# Patient Record
Sex: Male | Born: 1975 | Race: White | Hispanic: No | Marital: Single | Smoking: Current every day smoker
Health system: Southern US, Community
[De-identification: ages and names within clinical notes are randomized; demographics above are authoritative.]

## PROBLEM LIST (undated history)

## (undated) DIAGNOSIS — S069X9A Unspecified intracranial injury with loss of consciousness of unspecified duration, initial encounter: Secondary | ICD-10-CM

## (undated) DIAGNOSIS — S069XAA Unspecified intracranial injury with loss of consciousness status unknown, initial encounter: Secondary | ICD-10-CM

## (undated) DIAGNOSIS — R569 Unspecified convulsions: Secondary | ICD-10-CM

---

## 2005-10-04 ENCOUNTER — Emergency Department: Payer: Self-pay | Admitting: Emergency Medicine

## 2006-07-03 ENCOUNTER — Emergency Department: Payer: Self-pay | Admitting: Emergency Medicine

## 2006-08-17 ENCOUNTER — Emergency Department: Payer: Self-pay | Admitting: Emergency Medicine

## 2007-09-20 ENCOUNTER — Emergency Department: Payer: Self-pay | Admitting: Emergency Medicine

## 2007-09-20 ENCOUNTER — Other Ambulatory Visit: Payer: Self-pay

## 2013-05-01 ENCOUNTER — Emergency Department: Payer: Self-pay | Admitting: Emergency Medicine

## 2013-06-21 ENCOUNTER — Emergency Department: Payer: Self-pay | Admitting: Emergency Medicine

## 2014-06-22 ENCOUNTER — Emergency Department: Payer: Self-pay | Admitting: Emergency Medicine

## 2015-05-20 ENCOUNTER — Emergency Department: Payer: Self-pay

## 2015-05-20 ENCOUNTER — Encounter: Payer: Self-pay | Admitting: Medical Oncology

## 2015-05-20 ENCOUNTER — Emergency Department
Admission: EM | Admit: 2015-05-20 | Discharge: 2015-05-20 | Disposition: A | Discharge status: Home / Self Care | Payer: Self-pay | Attending: Emergency Medicine | Admitting: Emergency Medicine

## 2015-05-20 DIAGNOSIS — F17200 Nicotine dependence, unspecified, uncomplicated: Secondary | ICD-10-CM | POA: Insufficient documentation

## 2015-05-20 DIAGNOSIS — F419 Anxiety disorder, unspecified: Secondary | ICD-10-CM | POA: Insufficient documentation

## 2015-05-20 DIAGNOSIS — R251 Tremor, unspecified: Secondary | ICD-10-CM | POA: Insufficient documentation

## 2015-05-20 DIAGNOSIS — R079 Chest pain, unspecified: Secondary | ICD-10-CM | POA: Insufficient documentation

## 2015-05-20 HISTORY — DX: Unspecified convulsions: R56.9

## 2015-05-20 LAB — BASIC METABOLIC PANEL
ANION GAP: 6 (ref 5–15)
BUN: 16 mg/dL (ref 6–20)
CHLORIDE: 106 mmol/L (ref 101–111)
CO2: 26 mmol/L (ref 22–32)
Calcium: 8.9 mg/dL (ref 8.9–10.3)
Creatinine, Ser: 0.89 mg/dL (ref 0.61–1.24)
GFR calc Af Amer: >60 mL/min (ref >60)
GFR calc non Af Amer: >60 mL/min (ref >60)
Glucose, Bld: 99 mg/dL (ref 65–99)
Potassium: 4 mmol/L (ref 3.5–5.1)
Sodium: 138 mmol/L (ref 135–145)

## 2015-05-20 LAB — CBC
HCT: 44.7 % (ref 40.0–52.0)
HEMOGLOBIN: 15.1 g/dL (ref 13.0–18.0)
MCH: 30.7 pg (ref 26.0–34.0)
MCHC: 33.9 g/dL (ref 32.0–36.0)
MCV: 90.7 fL (ref 80.0–100.0)
Platelets: 360 10*3/uL (ref 150–440)
RBC: 4.93 MIL/uL (ref 4.40–5.90)
RDW: 13.9 % (ref 11.5–14.5)
WBC: 11.8 10*3/uL — ABNORMAL HIGH (ref 3.8–10.6)

## 2015-05-20 LAB — TROPONIN I

## 2015-05-20 NOTE — ED Notes (Signed)
Pt to triage via ems with reports that pt was sitting at home when he suddenly began to feel like he was going to pass out, began having tremors and chest pain.

## 2015-05-20 NOTE — ED Notes (Signed)
Attempted to call patient several times. Dr Cyril Loosen tried to call number on file for patient, no answer.

## 2015-06-07 ENCOUNTER — Emergency Department
Admission: EM | Admit: 2015-06-07 | Discharge: 2015-06-07 | Disposition: A | Discharge status: Home / Self Care | Payer: Self-pay | Attending: Emergency Medicine | Admitting: Emergency Medicine

## 2015-06-07 ENCOUNTER — Emergency Department: Payer: Self-pay

## 2015-06-07 ENCOUNTER — Encounter: Payer: Self-pay | Admitting: *Deleted

## 2015-06-07 DIAGNOSIS — R569 Unspecified convulsions: Secondary | ICD-10-CM

## 2015-06-07 DIAGNOSIS — R202 Paresthesia of skin: Secondary | ICD-10-CM | POA: Insufficient documentation

## 2015-06-07 DIAGNOSIS — F17200 Nicotine dependence, unspecified, uncomplicated: Secondary | ICD-10-CM | POA: Insufficient documentation

## 2015-06-07 LAB — CBC WITH DIFFERENTIAL/PLATELET
BASOS ABS: 0.1 10*3/uL (ref 0–0.1)
BASOS PCT: 1 %
EOS ABS: 0.1 10*3/uL (ref 0–0.7)
EOS PCT: 1 %
HEMATOCRIT: 43.5 % (ref 40.0–52.0)
Hemoglobin: 14.6 g/dL (ref 13.0–18.0)
Lymphocytes Relative: 22 %
Lymphs Abs: 2.2 10*3/uL (ref 1.0–3.6)
MCH: 30.8 pg (ref 26.0–34.0)
MCHC: 33.7 g/dL (ref 32.0–36.0)
MCV: 91.3 fL (ref 80.0–100.0)
MONO ABS: 1 10*3/uL (ref 0.2–1.0)
MONOS PCT: 9 %
NEUTROS ABS: 6.7 10*3/uL — AB (ref 1.4–6.5)
Neutrophils Relative %: 67 %
PLATELETS: 296 10*3/uL (ref 150–440)
RBC: 4.76 MIL/uL (ref 4.40–5.90)
RDW: 13.5 % (ref 11.5–14.5)
WBC: 10.2 10*3/uL (ref 3.8–10.6)

## 2015-06-07 LAB — COMPREHENSIVE METABOLIC PANEL
ALBUMIN: 4 g/dL (ref 3.5–5.0)
ALT: 21 U/L (ref 17–63)
ANION GAP: 8 (ref 5–15)
AST: 23 U/L (ref 15–41)
Alkaline Phosphatase: 58 U/L (ref 38–126)
BILIRUBIN TOTAL: 0.5 mg/dL (ref 0.3–1.2)
BUN: 17 mg/dL (ref 6–20)
CHLORIDE: 104 mmol/L (ref 101–111)
CO2: 25 mmol/L (ref 22–32)
Calcium: 9 mg/dL (ref 8.9–10.3)
Creatinine, Ser: 0.92 mg/dL (ref 0.61–1.24)
GFR calc Af Amer: >60 mL/min (ref >60)
GFR calc non Af Amer: >60 mL/min (ref >60)
GLUCOSE: 99 mg/dL (ref 65–99)
POTASSIUM: 4 mmol/L (ref 3.5–5.1)
SODIUM: 137 mmol/L (ref 135–145)
TOTAL PROTEIN: 7 g/dL (ref 6.5–8.1)

## 2015-06-07 LAB — TROPONIN I
TROPONIN I: 0.03 ng/mL (ref <0.031)
Troponin I: <0.03 ng/mL (ref <0.031)

## 2015-06-07 LAB — VALPROIC ACID LEVEL: VALPROIC ACID LVL: 61 ug/mL (ref 50.0–100.0)

## 2015-06-07 MED ORDER — LORAZEPAM 2 MG/ML IJ SOLN
INTRAMUSCULAR | Status: AC
  Administered 2015-06-07: 1 mg via INTRAVENOUS
  Filled 2015-06-07: qty 1

## 2015-06-07 MED ORDER — LORAZEPAM 2 MG/ML IJ SOLN
1.0000 mg | Freq: Once | INTRAMUSCULAR | Status: AC
  Administered 2015-06-07: 1 mg via INTRAVENOUS

## 2015-06-07 MED ORDER — GADOBENATE DIMEGLUMINE 529 MG/ML IV SOLN
15.0000 mL | Freq: Once | INTRAVENOUS | Status: AC | PRN
  Administered 2015-06-07: 12 mL via INTRAVENOUS

## 2015-06-07 NOTE — ED Notes (Signed)
Patient transported to CT 

## 2015-06-07 NOTE — Discharge Instructions (Signed)
Paresthesia  Paresthesia is a burning or prickling feeling. This feeling can happen in any part of the body. It often happens in the hands, arms, legs, or feet. Usually, it is not painful. In most cases, the feeling goes away in a short time and is not a sign of a serious problem.  HOME CARE  · Avoid drinking alcohol.  · Try massage or needle therapy (acupuncture) to help with your problems.  · Keep all follow-up visits as told by your doctor. This is important.  GET HELP IF:  · You keep on having episodes of paresthesia.  · Your burning or prickling feeling gets worse when you walk.  · You have pain or cramps.  · You feel dizzy.  · You have a rash.  GET HELP RIGHT AWAY IF:  · You feel weak.  · You have trouble walking or moving.  · You have problems speaking, understanding, or seeing.  · You feel confused.  · You cannot control when you pee (urinate) or poop (bowel movement).  · You lose feeling (numbness) after an injury.  · You pass out (faint).     This information is not intended to replace advice given to you by your health care provider. Make sure you discuss any questions you have with your health care provider.     Document Released: 03/20/2008 Document Revised: 08/22/2014 Document Reviewed: 04/03/2014  Elsevier Interactive Patient Education ©2016 [employer]

## 2015-06-07 NOTE — ED Notes (Signed)
Patient transported to MRI 
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
## 2015-06-07 NOTE — ED Notes (Addendum)
States 45 minutes ago he was sitting in the car and felt numbness in his face, states he feels as if he is going to pass out, states hx of seizures, pt unable to give clear description of symptoms in triage, states tension in his head and dry mouth

## 2015-06-20 ENCOUNTER — Encounter: Payer: Self-pay | Admitting: Emergency Medicine

## 2015-06-20 ENCOUNTER — Emergency Department
Admission: EM | Admit: 2015-06-20 | Discharge: 2015-06-20 | Disposition: A | Discharge status: Home / Self Care | Payer: Self-pay | Attending: Emergency Medicine | Admitting: Emergency Medicine

## 2015-06-20 DIAGNOSIS — R61 Generalized hyperhidrosis: Secondary | ICD-10-CM | POA: Insufficient documentation

## 2015-06-20 DIAGNOSIS — R251 Tremor, unspecified: Secondary | ICD-10-CM | POA: Insufficient documentation

## 2015-06-20 DIAGNOSIS — R569 Unspecified convulsions: Secondary | ICD-10-CM | POA: Insufficient documentation

## 2015-06-20 DIAGNOSIS — F17210 Nicotine dependence, cigarettes, uncomplicated: Secondary | ICD-10-CM | POA: Insufficient documentation

## 2015-06-20 HISTORY — DX: Unspecified intracranial injury with loss of consciousness status unknown, initial encounter: S06.9XAA

## 2015-06-20 HISTORY — DX: Unspecified intracranial injury with loss of consciousness of unspecified duration, initial encounter: S06.9X9A

## 2015-06-20 LAB — BASIC METABOLIC PANEL
Anion gap: 9 (ref 5–15)
BUN: 24 mg/dL — AB (ref 6–20)
CHLORIDE: 104 mmol/L (ref 101–111)
CO2: 27 mmol/L (ref 22–32)
Calcium: 9.8 mg/dL (ref 8.9–10.3)
Creatinine, Ser: 0.98 mg/dL (ref 0.61–1.24)
GFR calc Af Amer: >60 mL/min (ref >60)
GFR calc non Af Amer: >60 mL/min (ref >60)
GLUCOSE: 94 mg/dL (ref 65–99)
POTASSIUM: 4.3 mmol/L (ref 3.5–5.1)
SODIUM: 140 mmol/L (ref 135–145)

## 2015-06-20 LAB — CBC
HEMATOCRIT: 43.2 % (ref 40.0–52.0)
Hemoglobin: 14.9 g/dL (ref 13.0–18.0)
MCH: 31.2 pg (ref 26.0–34.0)
MCHC: 34.5 g/dL (ref 32.0–36.0)
MCV: 90.5 fL (ref 80.0–100.0)
Platelets: 310 10*3/uL (ref 150–440)
RBC: 4.77 MIL/uL (ref 4.40–5.90)
RDW: 13.6 % (ref 11.5–14.5)
WBC: 10.5 10*3/uL (ref 3.8–10.6)

## 2015-06-20 LAB — URINALYSIS COMPLETE WITH MICROSCOPIC (ARMC ONLY)
BACTERIA UA: NONE SEEN
Bilirubin Urine: NEGATIVE
GLUCOSE, UA: NEGATIVE mg/dL
HGB URINE DIPSTICK: NEGATIVE
Ketones, ur: NEGATIVE mg/dL
LEUKOCYTES UA: NEGATIVE
Nitrite: NEGATIVE
PH: 6 (ref 5.0–8.0)
PROTEIN: NEGATIVE mg/dL
Specific Gravity, Urine: 1.023 (ref 1.005–1.030)

## 2015-06-20 NOTE — ED Notes (Signed)
Called again   No answer in lobby
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
## 2015-06-20 NOTE — ED Notes (Signed)
Called   No answer in lobby  

## 2015-09-14 ENCOUNTER — Emergency Department: Payer: Self-pay

## 2015-09-14 ENCOUNTER — Encounter: Payer: Self-pay | Admitting: Emergency Medicine

## 2015-09-14 ENCOUNTER — Emergency Department
Admission: EM | Admit: 2015-09-14 | Discharge: 2015-09-14 | Disposition: A | Discharge status: Home / Self Care | Payer: Self-pay | Attending: Emergency Medicine | Admitting: Emergency Medicine

## 2015-09-14 DIAGNOSIS — F1290 Cannabis use, unspecified, uncomplicated: Secondary | ICD-10-CM | POA: Insufficient documentation

## 2015-09-14 DIAGNOSIS — F419 Anxiety disorder, unspecified: Secondary | ICD-10-CM | POA: Insufficient documentation

## 2015-09-14 DIAGNOSIS — Z8669 Personal history of other diseases of the nervous system and sense organs: Secondary | ICD-10-CM | POA: Insufficient documentation

## 2015-09-14 DIAGNOSIS — R55 Syncope and collapse: Secondary | ICD-10-CM | POA: Insufficient documentation

## 2015-09-14 DIAGNOSIS — F17210 Nicotine dependence, cigarettes, uncomplicated: Secondary | ICD-10-CM | POA: Insufficient documentation

## 2015-09-14 DIAGNOSIS — R202 Paresthesia of skin: Secondary | ICD-10-CM | POA: Insufficient documentation

## 2015-09-14 LAB — DIFFERENTIAL
BASOS ABS: 0 10*3/uL (ref 0–0.1)
Basophils Relative: 0 %
Eosinophils Absolute: 0.7 10*3/uL (ref 0–0.7)
Eosinophils Relative: 6 %
LYMPHS ABS: 3 10*3/uL (ref 1.0–3.6)
Monocytes Absolute: 1.2 10*3/uL — ABNORMAL HIGH (ref 0.2–1.0)
Monocytes Relative: 10 %
Neutro Abs: 6.9 10*3/uL — ABNORMAL HIGH (ref 1.4–6.5)

## 2015-09-14 LAB — URINALYSIS COMPLETE WITH MICROSCOPIC (ARMC ONLY)
BILIRUBIN URINE: NEGATIVE
Bacteria, UA: NONE SEEN
Glucose, UA: NEGATIVE mg/dL
HGB URINE DIPSTICK: NEGATIVE
KETONES UR: NEGATIVE mg/dL
LEUKOCYTES UA: NEGATIVE
NITRITE: NEGATIVE
PH: 5 (ref 5.0–8.0)
PROTEIN: NEGATIVE mg/dL
SPECIFIC GRAVITY, URINE: 1.028 (ref 1.005–1.030)

## 2015-09-14 LAB — URINE DRUG SCREEN, QUALITATIVE (ARMC ONLY)
Amphetamines, Ur Screen: NOT DETECTED
BARBITURATES, UR SCREEN: NOT DETECTED
Benzodiazepine, Ur Scrn: NOT DETECTED
CANNABINOID 50 NG, UR ~~LOC~~: NOT DETECTED
COCAINE METABOLITE, UR ~~LOC~~: NOT DETECTED
MDMA (ECSTASY) UR SCREEN: NOT DETECTED
Methadone Scn, Ur: NOT DETECTED
OPIATE, UR SCREEN: NOT DETECTED
PHENCYCLIDINE (PCP) UR S: NOT DETECTED
TRICYCLIC, UR SCREEN: POSITIVE — AB

## 2015-09-14 LAB — COMPREHENSIVE METABOLIC PANEL
ALBUMIN: 4 g/dL (ref 3.5–5.0)
ALK PHOS: 76 U/L (ref 38–126)
ALT: 35 U/L (ref 17–63)
ANION GAP: 7 (ref 5–15)
AST: 20 U/L (ref 15–41)
BILIRUBIN TOTAL: 0.3 mg/dL (ref 0.3–1.2)
BUN: 16 mg/dL (ref 6–20)
CALCIUM: 9.1 mg/dL (ref 8.9–10.3)
CO2: 27 mmol/L (ref 22–32)
Chloride: 103 mmol/L (ref 101–111)
Creatinine, Ser: 0.84 mg/dL (ref 0.61–1.24)
GFR calc Af Amer: >60 mL/min (ref >60)
GFR calc non Af Amer: >60 mL/min (ref >60)
GLUCOSE: 91 mg/dL (ref 65–99)
POTASSIUM: 4.4 mmol/L (ref 3.5–5.1)
SODIUM: 137 mmol/L (ref 135–145)
TOTAL PROTEIN: 7 g/dL (ref 6.5–8.1)

## 2015-09-14 LAB — CBC
HCT: 42.7 % (ref 40.0–52.0)
HEMOGLOBIN: 14.3 g/dL (ref 13.0–18.0)
MCH: 29.7 pg (ref 26.0–34.0)
MCHC: 33.4 g/dL (ref 32.0–36.0)
MCV: 89.1 fL (ref 80.0–100.0)
PLATELETS: 317 10*3/uL (ref 150–440)
RBC: 4.79 MIL/uL (ref 4.40–5.90)
RDW: 14.2 % (ref 11.5–14.5)
WBC: 11.7 10*3/uL — AB (ref 3.8–10.6)

## 2015-09-14 LAB — PROTIME-INR
INR: 1.02
PROTHROMBIN TIME: 13.6 s (ref 11.4–15.0)

## 2015-09-14 LAB — VALPROIC ACID LEVEL: Valproic Acid Lvl: 53 ug/mL (ref 50.0–100.0)

## 2015-09-14 LAB — ETHANOL: Alcohol, Ethyl (B): <5 mg/dL (ref <5)

## 2015-09-14 LAB — APTT: APTT: 33 s (ref 24–36)

## 2015-09-14 LAB — LIPASE, BLOOD: Lipase: 28 U/L (ref 11–51)

## 2015-09-14 NOTE — Consult Note (Signed)
Code Stroke page to ED; patient believed to have signs/sypmtoms of potential stroke; chaplain offered pastoral presence and support to patient and significant other, Justin Donaldson, at the bedside prior to testing being completed.  Pt stated he believes he is okay, not a stroke, but wanted to have things checked out to be sure.  Chaplain remained with couple for a few minutes until he was taken for testing; they state they are good and will alert nurse if further support is desired.

## 2015-09-14 NOTE — ED Provider Notes (Signed)
[facility] Emergency Department Provider Note   ____________________________________________  Time seen: Approximately 5 PM  I have reviewed the triage vital signs and the nursing notes.   HISTORY  Chief Complaint Facial Droop    HPI Justin Donaldson is a 40 y.o. male with a history of seizures and anxiety who is presenting to the emergency department today with right-sided facial numbness and tingling which started at 2 PM. The patient says that he was playing with his daughter at the time that the episode began. He denies any weakness or numbness in his upper or lower extremities. Was noted to have a right-sided facial droop by the nurse in triage made him a stroke alert. Patient at this time says that his symptoms have improved. Says that he has been having tingling around his mouth over the past week and has had increased watery with some anxiety. Says that he has had anxiety in the past. Taking Depakote for seizures.   Past Medical History  Diagnosis Date  . Seizures (HCC)   . Traumatic brain injury (HCC)     There are no active problems to display for this patient.   History reviewed. No pertinent past surgical history.  Current Outpatient Rx  Name  Route  Sig  Dispense  Refill  . busPIRone (BUSPAR) 10 MG tablet   Oral   Take 10 mg by mouth 3 (three) times daily.         . divalproex (DEPAKOTE) 500 MG DR tablet   Oral   Take 500 mg by mouth 2 (two) times daily.           Allergies Review of patient's allergies indicates no known allergies.  No family history on file.  Social History Social History  Substance Use Topics  . Smoking status: Current Every Day Smoker -- 1.00 packs/day    Types: Cigarettes  . Smokeless tobacco: None  . Alcohol Use: No     Comment: occ    Review of Systems Constitutional: No fever/chills Eyes: No visual changes. ENT: No sore throat. Cardiovascular: Denies chest pain. Respiratory: Denies  shortness of breath. Gastrointestinal: No abdominal pain.  No nausea, no vomiting.  No diarrhea.  No constipation. Genitourinary: Negative for dysuria. Musculoskeletal: Negative for back pain. Skin: Negative for rash. Neurological: Negative for headaches, focal weakness or 10-point ROS otherwise negative.  ____________________________________________   PHYSICAL EXAM:  VITAL SIGNS: ED Triage Vitals  Enc Vitals Group     BP 09/14/15 1655 133/85 mmHg     Pulse Rate 09/14/15 1655 81     Resp 09/14/15 1655 20     Temp 09/14/15 1655 98.7 F (37.1 C)     Temp Source 09/14/15 1655 Oral     SpO2 09/14/15 1655 98 %     Weight 09/14/15 1655 140 lb (63.504 kg)     Height 09/14/15 1655  (1.778 m)     Head Cir --      Peak Flow --      Pain Score 09/14/15 1655 4     Pain Loc --      Pain Edu? --      Excl. in GC? --     Constitutional: Alert and oriented. Well appearing and in no acute distress. Eyes: Conjunctivae are normal. PERRL. EOMI. Head: Atraumatic. Nose: No congestion/rhinnorhea. Mouth/Throat: Mucous membranes are moist.   Neck: No stridor.   Cardiovascular: Normal rate, regular rhythm. Grossly normal heart sounds.   Respiratory: Normal respiratory effort.  No retractions. Lungs CTAB. Gastrointestinal: Soft and nontender. No distention. no CVA tenderness. Musculoskeletal: No lower extremity tenderness nor edema.  No joint effusions. Neurologic:  Normal speech and language. No gross focal neurologic deficits are appreciated.  Skin:  Skin is warm, dry and intact. No rash noted. Psychiatric: Mood and affect are normal. Speech and behavior are normal.  NIH Stroke Scale   Initial exam Person Administering Scale: Arelia Longest  Administer stroke scale items in the order listed. Record performance in each category after each subscale exam. Do not go back and change scores. Follow directions provided for each exam technique. Scores should reflect what the patient  does, not what the clinician thinks the patient can do. The clinician should record answers while administering the exam and work quickly. Except where indicated, the patient should not be coached (i.e., repeated requests to patient to make a special effort).   1a  Level of consciousness: 0=alert; keenly responsive  1b. LOC questions:  0=Performs both tasks correctly  1c. LOC commands: 0=Performs both tasks correctly  2.  Best Gaze: 0=normal  3.  Visual: 0=No visual loss  4. Facial Palsy: 0=Normal symmetric movement  5a.  Motor left arm: 0=No drift, limb holds 90 (or 45) degrees for full 10 seconds  5b.  Motor right arm: 0=No drift, limb holds 90 (or 45) degrees for full 10 seconds  6a. motor left leg: 0=No drift, limb holds 90 (or 45) degrees for full 10 seconds  6b  Motor right leg:  0=No drift, limb holds 90 (or 45) degrees for full 10 seconds  7. Limb Ataxia: 0=Absent  8.  Sensory: 0=Normal; no sensory loss  9. Best Language:  0=No aphasia, normal  10. Dysarthria: 0=Normal  11. Extinction and Inattention: 0=No abnormality  12. Distal motor function: 0=Normal   Total:   0   ____________________________________________   LABS (all labs ordered are listed, but only abnormal results are displayed)  Labs Reviewed  CBC - Abnormal; Notable for the following:    WBC 11.7 (*)    All other components within normal limits  DIFFERENTIAL - Abnormal; Notable for the following:    Neutro Abs 6.9 (*)    Monocytes Absolute 1.2 (*)    All other components within normal limits  URINALYSIS COMPLETEWITH MICROSCOPIC ([facility] ONLY) - Abnormal; Notable for the following:    Color, Urine YELLOW (*)    APPearance CLEAR (*)    Squamous Epithelial / LPF 0-5 (*)    All other components within normal limits  URINE DRUG SCREEN, QUALITATIVE ([facility] ONLY) - Abnormal; Notable for the following:    Tricyclic, Ur Screen POSITIVE (*)    All other components within normal limits  ETHANOL  PROTIME-INR  APTT    COMPREHENSIVE METABOLIC PANEL  LIPASE, BLOOD  VALPROIC ACID LEVEL  I-STAT CHEM 8, ED  I-STAT TROPOININ, ED   ____________________________________________  EKG  ED ECG REPORT I, Arelia Longest, the attending physician, personally viewed and interpreted this ECG.   Date: 09/14/2015  EKG Time: 1749  Rate: 67  Rhythm: normal sinus rhythm  Axis: Normal axis  Intervals:none  ST&T Change: Mild elevations in 2, 3 and aVF as well as V3 and V4 and V5 with concave morphology consistent with benign early repolarization. V2 with T wave inversion which is likely related to lead position.  ____________________________________________  RADIOLOGY   DG Chest 2 View (Final result) Result time: 09/14/15 18:26:32   Final result by Rad Results In  Interface (09/14/15 18:26:32)   Narrative:   CLINICAL DATA: Chest pain with shortness of Breath  EXAM: CHEST 2 VIEW  COMPARISON: None.  FINDINGS: The heart size and mediastinal contours are within normal limits. Both lungs are clear. The visualized skeletal structures are unremarkable.  IMPRESSION: No active cardiopulmonary disease.   Electronically Signed By: Alcide CleverMark Lukens M.D. On: 09/14/2015 18:26          CT Head Wo Contrast (Final result) Result time: 09/14/15 17:37:48   Final result by Rad Results In Interface (09/14/15 17:37:48)   Narrative:   CLINICAL DATA: Right-sided numbness.  EXAM: CT HEAD WITHOUT CONTRAST  TECHNIQUE: Contiguous axial images were obtained from the base of the skull through the vertex without intravenous contrast.  COMPARISON: CT and MRI scan of June 07, 2015.  FINDINGS: Bony calvarium appears intact. No mass effect or midline shift is noted. Ventricular size is within normal limits. There is no evidence of mass lesion, hemorrhage or acute infarction.  IMPRESSION: Normal head CT. These results were called by telephone at the time of interpretation on 09/14/2015 at 5:36 pm  to Dr. Alphonzo LemmingsMcShane, who verbally acknowledged these results.   Electronically Signed By: Lupita RaiderJames Green Jr, M.D. On: 09/14/2015 17:37       ____________________________________________   PROCEDURES    ____________________________________________   INITIAL IMPRESSION / ASSESSMENT AND PLAN / ED COURSE  Pertinent labs & imaging results that were available during my care of the patient were reviewed by me and considered in my medical decision making (see chart for details).  Complete resolution of symptoms but at time I saw the patient. Pending neurology evaluation at this time.  ----------------------------------------- 8:18 PM on 09/14/2015 -----------------------------------------  His custom case with the specialist on-call neurologist who believes the patient is not having a stroke but is likely having a near-syncopal episode. I discussed this with the patient says that his symptoms are completely resolved this time. He also denies any history of stroke and his family. One week of progressively worsening symptoms without any objective findings on his workup today. Has appointment this coming Tuesday with [facility] neurolog[facility] at [facility]. He knows he may return at any point for any worsening or concerning symptoms. Will be discharged home. We'll continue the patient's beta blocker. No episodes of regarding her hypertension here in the emergency department. ____________________________________________   FINAL CLINICAL IMPRESSION(S) / ED DIAGNOSES  Paresthesia. Near-syncope. Anxiety.    NEW MEDICATIONS STARTED DURING THIS VISIT:  New Prescriptions   No medications on file     Note:  This document was prepared using Dragon voice recognition software and may include unintentional dictation errors.    Myrna Blazeravid Matthew Schaevitz, MD 09/14/15 2019

## 2015-09-14 NOTE — ED Notes (Signed)
Reports around 1550 he started having right side pain and facial numbness and "twitching"/  Pt appears to have right side facial droop at this time. Right grip weaker. A&O, MAE

## 2016-07-23 ENCOUNTER — Emergency Department
Admission: EM | Admit: 2016-07-23 | Discharge: 2016-07-23 | Disposition: A | Discharge status: Home / Self Care | Payer: Self-pay | Attending: Student in an Organized Health Care Education/Training Program | Admitting: Student in an Organized Health Care Education/Training Program

## 2016-07-23 ENCOUNTER — Encounter: Payer: Self-pay | Admitting: Emergency Medicine

## 2016-07-23 DIAGNOSIS — S0502XA Injury of conjunctiva and corneal abrasion without foreign body, left eye, initial encounter: Secondary | ICD-10-CM | POA: Insufficient documentation

## 2016-07-23 DIAGNOSIS — Z79899 Other long term (current) drug therapy: Secondary | ICD-10-CM | POA: Insufficient documentation

## 2016-07-23 DIAGNOSIS — Y990 Civilian activity done for income or pay: Secondary | ICD-10-CM | POA: Insufficient documentation

## 2016-07-23 DIAGNOSIS — X58XXXA Exposure to other specified factors, initial encounter: Secondary | ICD-10-CM | POA: Insufficient documentation

## 2016-07-23 DIAGNOSIS — Y9389 Activity, other specified: Secondary | ICD-10-CM | POA: Insufficient documentation

## 2016-07-23 DIAGNOSIS — Y929 Unspecified place or not applicable: Secondary | ICD-10-CM | POA: Insufficient documentation

## 2016-07-23 DIAGNOSIS — T1592XA Foreign body on external eye, part unspecified, left eye, initial encounter: Secondary | ICD-10-CM

## 2016-07-23 DIAGNOSIS — F17210 Nicotine dependence, cigarettes, uncomplicated: Secondary | ICD-10-CM | POA: Insufficient documentation

## 2016-07-23 MED ORDER — POLYMYXIN B-TRIMETHOPRIM 10000-0.1 UNIT/ML-% OP SOLN: 2.0000 [drp] | Freq: Four times a day (QID) | OPHTHALMIC | 0 refills | Status: DC

## 2016-07-23 MED ORDER — FLUORESCEIN SODIUM 0.6 MG OP STRP
1.0000 | ORAL_STRIP | Freq: Once | OPHTHALMIC | Status: AC
  Administered 2016-07-23: 1 via OPHTHALMIC

## 2016-07-23 MED ORDER — TETRACAINE HCL 0.5 % OP SOLN
2.0000 [drp] | Freq: Once | OPHTHALMIC | Status: AC
  Administered 2016-07-23: 2 [drp] via OPHTHALMIC

## 2016-07-23 MED ORDER — FLUORESCEIN SODIUM 1 MG OP STRP
ORAL_STRIP | OPHTHALMIC | Status: AC
  Administered 2016-07-23: 1 via OPHTHALMIC
  Filled 2016-07-23: qty 1

## 2016-07-23 MED ORDER — KETOROLAC TROMETHAMINE 0.5 % OP SOLN: 1.0000 [drp] | Freq: Four times a day (QID) | OPHTHALMIC | 0 refills | Status: DC

## 2016-07-23 MED ORDER — TETRACAINE HCL 0.5 % OP SOLN
OPHTHALMIC | Status: AC
  Administered 2016-07-23: 2 [drp] via OPHTHALMIC
  Filled 2016-07-23: qty 2

## 2016-07-23 NOTE — ED Notes (Signed)
Pt discharged to home.  Family member driving.  Discharge instructions reviewed.  Verbalized understanding.  No questions or concerns at this time.  Teach back verified.  Pt in NAD.  No items left in ED.   

## 2016-07-23 NOTE — ED Provider Notes (Signed)
[facility] Emergency Department Provider Note  ____________________________________________  Time seen: Approximately 9:04 PM  I have reviewed the triage vital signs and the nursing notes.   HISTORY  Chief Complaint Eye Pain and Eye Drainage    HPI Justin Donaldson is a 41 y.o. male who presents emergency department complaining of left eye pain and foreign body sensation. Patient reports that last night he was grinding a piece of metal when he believes that a piece of metal fall into his eye. Patient has had a foreign body since. He denies any visual changes. No other complaints at this time. No medications prior to arrival. Patient has irrigated the eye with water multiple times.   Past Medical History:  Diagnosis Date  . Seizures (HCC)   . Traumatic brain injury (HCC)     There are no active problems to display for this patient.   History reviewed. No pertinent surgical history.  Prior to Admission medications   Medication Sig Start Date End Date Taking? Authorizing Provider  busPIRone (BUSPAR) 10 MG tablet Take 10 mg by mouth 3 (three) times daily.    Historical Provider, MD  divalproex (DEPAKOTE) 500 MG DR tablet Take 500 mg by mouth 2 (two) times daily.    Historical Provider, MD  ketorolac (ACULAR) 0.5 % ophthalmic solution Place 1 drop into the left eye 4 (four) times daily. 07/23/16   Delorise Royals Cuthriell, PA-C  trimethoprim-polymyxin b (POLYTRIM) ophthalmic solution Place 2 drops into the left eye every 6 (six) hours. 07/23/16   Delorise Royals Cuthriell, PA-C    Allergies Patient has no known allergies.  No family history on file.  Social History Social History  Substance Use Topics  . Smoking status: Current Every Day Smoker    Packs/day: 1.00    Types: Cigarettes  . Smokeless tobacco: Never Used  . Alcohol use Yes     Comment: occ     Review of Systems  Constitutional: No fever/chills Eyes: No visual changes. No discharge. Positive  for pain and foreign body sensation to the left eye ENT: No upper respiratory complaints. Cardiovascular: no chest pain. Respiratory: no cough. No SOB. Musculoskeletal: Negative for musculoskeletal pain. Skin: Negative for rash, abrasions, lacerations, ecchymosis. Neurological: Negative for headaches, focal weakness or numbness. 10-point ROS otherwise negative.  ____________________________________________   PHYSICAL EXAM:  VITAL SIGNS: ED Triage Vitals [07/23/16 2026]  Enc Vitals Group     BP 132/78     Pulse Rate 90     Resp 20     Temp 97.9 F (36.6 C)     Temp Source Oral     SpO2 97 %     Weight 140 lb (63.5 kg)     Height  (1.753 m)     Head Circumference      Peak Flow      Pain Score 4     Pain Loc      Pain Edu?      Excl. in GC?      Constitutional: Alert and oriented. Well appearing and in no acute distress. Eyes: Conjunctivae are normal. PERRL. EOMI.Visualization with funduscopic exam reveals foreign body to the 3:00 position of the left eye. Head: Atraumatic. ENT:      Ears:       Nose: No congestion/rhinnorhea.      Mouth/Throat: Mucous membranes are moist.  Neck: No stridor.    Cardiovascular: Normal rate, regular rhythm. Normal S1 and S2.  Good peripheral circulation. Respiratory: Normal  respiratory effort without tachypnea or retractions. Lungs CTAB. Good air entry to the bases with no decreased or absent breath sounds. Musculoskeletal: Full range of motion to all extremities. No gross deformities appreciated. Neurologic:  Normal speech and language. No gross focal neurologic deficits are appreciated.  Skin:  Skin is warm, dry and intact. No rash noted. Psychiatric: Mood and affect are normal. Speech and behavior are normal. Patient exhibits appropriate insight and judgement.   ____________________________________________   LABS (all labs ordered are listed, but only abnormal results are displayed)  Labs Reviewed - No data to  display ____________________________________________  EKG   ____________________________________________  RADIOLOGY   No results found.  ____________________________________________    PROCEDURES  Procedure(s) performed:    .Foreign Body Removal Date/Time: 07/23/2016 9:54 PM Performed by: Gala Romney D Authorized by: Gala Romney D  Consent: Verbal consent obtained. Risks and benefits: risks, benefits and alternatives were discussed Consent given by: patient Patient understanding: patient states understanding of the procedure being performed Patient identity confirmed: verbally with patient Body area: eye Location details: left cornea  Anesthesia: Local Anesthetic: tetracaine drops  Sedation: Patient sedated: no Patient restrained: no Patient cooperative: yes Localization method: magnification and visualized Removal mechanism: irrigation, moist cotton swab and 25-gauge needle Eye examined with fluorescein. Fluorescein uptake. Corneal abrasion size: medium Corneal abrasion location: inferior No residual rust ring present. Dressing: antibiotic drops Depth: embedded Complexity: simple 1 objects recovered. Objects recovered: metal shaving Post-procedure assessment: foreign body removed Patient tolerance: Patient tolerated the procedure well with no immediate complications      Medications  fluorescein ophthalmic strip 1 strip (not administered)  tetracaine (PONTOCAINE) 0.5 % ophthalmic solution 2 drop (not administered)     ____________________________________________   INITIAL IMPRESSION / ASSESSMENT AND PLAN / ED COURSE  Pertinent labs & imaging results that were available during my care of the patient were reviewed by me and considered in my medical decision making (see chart for details).  Review of the [place] CSRS was performed in accordance of the [facility] prior to dispensing any controlled drugs.     Patient's diagnosis is  consistent with Foreign body and corneal abrasion to the left eye. Foreign body is visualized with funduscopic exam and fluorescein staining. foreign body successfully removed using 25-gauge needle. No residual rust ring. Patient will be placed on antibiotic eye drops and Acular for symptom control. Patient will follow-up with ophthalmology as needed.. Patient is given ED precautions to return to the ED for any worsening or new symptoms.     ____________________________________________  FINAL CLINICAL IMPRESSION(S) / ED DIAGNOSES  Final diagnoses:  Foreign body of left eye, initial encounter  Abrasion of left cornea, initial encounter      NEW MEDICATIONS STARTED DURING THIS VISIT:  New Prescriptions   KETOROLAC (ACULAR) 0.5 % OPHTHALMIC SOLUTION    Place 1 drop into the left eye 4 (four) times daily.   TRIMETHOPRIM-POLYMYXIN B (POLYTRIM) OPHTHALMIC SOLUTION    Place 2 drops into the left eye every 6 (six) hours.        This chart was dictated using voice recognition software/Dragon. Despite best efforts to proofread, errors can occur which can change the meaning. Any change was purely unintentional.    Racheal Patches, PA-C 07/23/16 2156    Willy Eddy, MD 07/23/16 2328

## 2016-07-23 NOTE — ED Triage Notes (Signed)
Pt presents to ED with left eye pain and drainage. Pt states onset of pain was last night at work while grinding a piece of metal.

## 2016-12-05 ENCOUNTER — Emergency Department: Payer: Self-pay

## 2016-12-05 ENCOUNTER — Observation Stay
Admit: 2016-12-05 | Discharge: 2016-12-05 | Disposition: A | Discharge status: Home / Self Care | Payer: Self-pay | Attending: Internal Medicine | Admitting: Internal Medicine

## 2016-12-05 ENCOUNTER — Encounter: Payer: Self-pay | Admitting: Intensive Care

## 2016-12-05 ENCOUNTER — Observation Stay
Admission: EM | Admit: 2016-12-05 | Discharge: 2016-12-07 | Disposition: A | Discharge status: Home / Self Care | Payer: Self-pay | Attending: Internal Medicine | Admitting: Internal Medicine

## 2016-12-05 DIAGNOSIS — D72829 Elevated white blood cell count, unspecified: Secondary | ICD-10-CM | POA: Insufficient documentation

## 2016-12-05 DIAGNOSIS — F17210 Nicotine dependence, cigarettes, uncomplicated: Secondary | ICD-10-CM | POA: Insufficient documentation

## 2016-12-05 DIAGNOSIS — R079 Chest pain, unspecified: Principal | ICD-10-CM | POA: Diagnosis present

## 2016-12-05 DIAGNOSIS — I313 Pericardial effusion (noninflammatory): Secondary | ICD-10-CM | POA: Insufficient documentation

## 2016-12-05 DIAGNOSIS — G40909 Epilepsy, unspecified, not intractable, without status epilepticus: Secondary | ICD-10-CM | POA: Insufficient documentation

## 2016-12-05 DIAGNOSIS — Z79899 Other long term (current) drug therapy: Secondary | ICD-10-CM | POA: Insufficient documentation

## 2016-12-05 DIAGNOSIS — I301 Infective pericarditis: Secondary | ICD-10-CM | POA: Insufficient documentation

## 2016-12-05 DIAGNOSIS — I3139 Other pericardial effusion (noninflammatory): Secondary | ICD-10-CM

## 2016-12-05 DIAGNOSIS — R000 Tachycardia, unspecified: Secondary | ICD-10-CM | POA: Insufficient documentation

## 2016-12-05 LAB — LIPID PANEL
Cholesterol: 162 mg/dL (ref 0–200)
HDL: 39 mg/dL — ABNORMAL LOW (ref >40)
LDL CALC: 103 mg/dL — AB (ref 0–99)
TRIGLYCERIDES: 102 mg/dL (ref <150)
Total CHOL/HDL Ratio: 4.2 RATIO
VLDL: 20 mg/dL (ref 0–40)

## 2016-12-05 LAB — CBC
HEMATOCRIT: 38.6 % — AB (ref 40.0–52.0)
HEMOGLOBIN: 13.3 g/dL (ref 13.0–18.0)
MCH: 29.2 pg (ref 26.0–34.0)
MCHC: 34.5 g/dL (ref 32.0–36.0)
MCV: 84.7 fL (ref 80.0–100.0)
Platelets: 359 10*3/uL (ref 150–440)
RBC: 4.55 MIL/uL (ref 4.40–5.90)
RDW: 14.7 % — ABNORMAL HIGH (ref 11.5–14.5)
WBC: 20.1 10*3/uL — ABNORMAL HIGH (ref 3.8–10.6)

## 2016-12-05 LAB — COMPREHENSIVE METABOLIC PANEL
ALT: 19 U/L (ref 17–63)
AST: 17 U/L (ref 15–41)
Albumin: 3.4 g/dL — ABNORMAL LOW (ref 3.5–5.0)
Alkaline Phosphatase: 79 U/L (ref 38–126)
Anion gap: 8 (ref 5–15)
BUN: 16 mg/dL (ref 6–20)
CHLORIDE: 101 mmol/L (ref 101–111)
CO2: 22 mmol/L (ref 22–32)
CREATININE: 0.72 mg/dL (ref 0.61–1.24)
Calcium: 8.8 mg/dL — ABNORMAL LOW (ref 8.9–10.3)
GFR calc non Af Amer: >60 mL/min (ref >60)
Glucose, Bld: 124 mg/dL — ABNORMAL HIGH (ref 65–99)
Potassium: 4.5 mmol/L (ref 3.5–5.1)
SODIUM: 131 mmol/L — AB (ref 135–145)
Total Bilirubin: 0.4 mg/dL (ref 0.3–1.2)
Total Protein: 7 g/dL (ref 6.5–8.1)

## 2016-12-05 LAB — PROTIME-INR
INR: 0.98
Prothrombin Time: 13 seconds (ref 11.4–15.2)

## 2016-12-05 LAB — ECHOCARDIOGRAM COMPLETE
HEIGHTINCHES: 70 in
Weight: 2432 oz

## 2016-12-05 LAB — PROCALCITONIN: Procalcitonin: <0.1 ng/mL

## 2016-12-05 LAB — TROPONIN I

## 2016-12-05 LAB — SEDIMENTATION RATE: SED RATE: 14 mm/h (ref 0–15)

## 2016-12-05 LAB — TSH: TSH: 2.459 u[IU]/mL (ref 0.350–4.500)

## 2016-12-05 LAB — APTT: APTT: 36 s (ref 24–36)

## 2016-12-05 LAB — LIPASE, BLOOD: Lipase: 21 U/L (ref 11–51)

## 2016-12-05 MED ORDER — LIDOCAINE HCL (PF) 1 % IJ SOLN
INTRAMUSCULAR | Status: AC
  Filled 2016-12-05: qty 30

## 2016-12-05 MED ORDER — SODIUM CHLORIDE 0.9 % IV SOLN
INTRAVENOUS | Status: DC
  Administered 2016-12-05: 10:00:00 via INTRAVENOUS

## 2016-12-05 MED ORDER — PROPRANOLOL HCL 10 MG PO TABS: 10.0000 mg | ORAL_TABLET | Freq: Three times a day (TID) | ORAL | Status: DC

## 2016-12-05 MED ORDER — INDOMETHACIN 50 MG PO CAPS
50.0000 mg | ORAL_CAPSULE | Freq: Three times a day (TID) | ORAL | Status: DC
  Administered 2016-12-05 – 2016-12-07 (×6): 50 mg via ORAL
  Filled 2016-12-05 (×10): qty 1

## 2016-12-05 MED ORDER — ASPIRIN 81 MG PO CHEW: 324.0000 mg | CHEWABLE_TABLET | Freq: Once | ORAL | Status: DC

## 2016-12-05 MED ORDER — NICOTINE 21 MG/24HR TD PT24
21.0000 mg | MEDICATED_PATCH | Freq: Every day | TRANSDERMAL | Status: DC
  Administered 2016-12-05 – 2016-12-07 (×3): 21 mg via TRANSDERMAL
  Filled 2016-12-05 (×3): qty 1

## 2016-12-05 MED ORDER — KETOROLAC TROMETHAMINE 30 MG/ML IJ SOLN
15.0000 mg | Freq: Once | INTRAMUSCULAR | Status: AC
  Administered 2016-12-05: 15 mg via INTRAVENOUS
  Filled 2016-12-05: qty 1

## 2016-12-05 MED ORDER — ACETAMINOPHEN 650 MG RE SUPP: 650.0000 mg | Freq: Four times a day (QID) | RECTAL | Status: DC | PRN

## 2016-12-05 MED ORDER — SODIUM CHLORIDE 0.9 % IV SOLN: 250.0000 mL | INTRAVENOUS | Status: DC | PRN

## 2016-12-05 MED ORDER — ONDANSETRON HCL 4 MG PO TABS: 4.0000 mg | ORAL_TABLET | Freq: Four times a day (QID) | ORAL | Status: DC | PRN

## 2016-12-05 MED ORDER — POLYMYXIN B-TRIMETHOPRIM 10000-0.1 UNIT/ML-% OP SOLN: 2.0000 [drp] | Freq: Four times a day (QID) | OPHTHALMIC | Status: DC

## 2016-12-05 MED ORDER — ACETAMINOPHEN 325 MG PO TABS
650.0000 mg | ORAL_TABLET | Freq: Four times a day (QID) | ORAL | Status: DC | PRN
Start: 2016-12-05 — End: 2016-12-07

## 2016-12-05 MED ORDER — SODIUM CHLORIDE 0.9 % IV BOLUS (SEPSIS)
1000.0000 mL | Freq: Once | INTRAVENOUS | Status: AC
  Administered 2016-12-05: 1000 mL via INTRAVENOUS

## 2016-12-05 MED ORDER — COLCHICINE 0.6 MG PO TABS
0.6000 mg | ORAL_TABLET | Freq: Two times a day (BID) | ORAL | Status: DC
  Administered 2016-12-05 – 2016-12-07 (×5): 0.6 mg via ORAL
  Filled 2016-12-05 (×7): qty 1

## 2016-12-05 MED ORDER — KETOROLAC TROMETHAMINE 0.5 % OP SOLN: 1.0000 [drp] | Freq: Four times a day (QID) | OPHTHALMIC | Status: DC

## 2016-12-05 MED ORDER — IOPAMIDOL (ISOVUE-370) INJECTION 76%
100.0000 mL | Freq: Once | INTRAVENOUS | Status: AC | PRN
  Administered 2016-12-05: 100 mL via INTRAVENOUS

## 2016-12-05 MED ORDER — ASPIRIN 81 MG PO CHEW
324.0000 mg | CHEWABLE_TABLET | Freq: Once | ORAL | Status: AC
  Administered 2016-12-05: 324 mg via ORAL

## 2016-12-05 MED ORDER — KETOROLAC TROMETHAMINE 30 MG/ML IJ SOLN: 15.0000 mg | Freq: Once | INTRAMUSCULAR | Status: DC

## 2016-12-05 MED ORDER — MORPHINE SULFATE (PF) 4 MG/ML IV SOLN
6.0000 mg | Freq: Once | INTRAVENOUS | Status: AC
  Administered 2016-12-05: 6 mg via INTRAVENOUS
  Filled 2016-12-05: qty 2

## 2016-12-05 MED ORDER — SERTRALINE HCL 50 MG PO TABS
200.0000 mg | ORAL_TABLET | Freq: Every day | ORAL | Status: DC
  Administered 2016-12-05 – 2016-12-07 (×3): 200 mg via ORAL
  Filled 2016-12-05 (×3): qty 4

## 2016-12-05 MED ORDER — HYDROXYZINE HCL 50 MG PO TABS
50.0000 mg | ORAL_TABLET | Freq: Four times a day (QID) | ORAL | Status: DC | PRN
  Filled 2016-12-05: qty 1

## 2016-12-05 MED ORDER — SODIUM CHLORIDE 0.9% FLUSH: 3.0000 mL | INTRAVENOUS | Status: DC | PRN

## 2016-12-05 MED ORDER — ONDANSETRON HCL 4 MG/2ML IJ SOLN: 4.0000 mg | Freq: Four times a day (QID) | INTRAMUSCULAR | Status: DC | PRN

## 2016-12-05 MED ORDER — DIVALPROEX SODIUM 500 MG PO DR TAB
500.0000 mg | DELAYED_RELEASE_TABLET | Freq: Two times a day (BID) | ORAL | Status: DC
  Administered 2016-12-05 – 2016-12-07 (×5): 500 mg via ORAL
  Filled 2016-12-05 (×6): qty 1

## 2016-12-05 MED ORDER — SODIUM CHLORIDE 0.9% FLUSH
3.0000 mL | Freq: Two times a day (BID) | INTRAVENOUS | Status: DC
  Administered 2016-12-05 – 2016-12-06 (×4): 3 mL via INTRAVENOUS

## 2016-12-05 MED ORDER — NITROGLYCERIN 0.4 MG SL SUBL
0.4000 mg | SUBLINGUAL_TABLET | SUBLINGUAL | Status: DC | PRN
  Administered 2016-12-05 (×2): 0.4 mg via SUBLINGUAL

## 2016-12-05 MED ORDER — HYDROCODONE-ACETAMINOPHEN 5-325 MG PO TABS
1.0000 | ORAL_TABLET | ORAL | Status: DC | PRN
  Administered 2016-12-05: 2 via ORAL
  Administered 2016-12-07: 1 via ORAL
  Filled 2016-12-05: qty 2
  Filled 2016-12-05: qty 1

## 2016-12-05 MED ORDER — HEPARIN SODIUM (PORCINE) 5000 UNIT/ML IJ SOLN
5000.0000 [IU] | Freq: Three times a day (TID) | INTRAMUSCULAR | Status: DC
  Administered 2016-12-05 – 2016-12-07 (×5): 5000 [IU] via SUBCUTANEOUS
  Filled 2016-12-05 (×5): qty 1

## 2016-12-05 NOTE — ED Notes (Signed)
Patient transported to CT 

## 2016-12-05 NOTE — ED Notes (Signed)
Pt diaphoretic. Defib pads are in place and on zoll

## 2016-12-05 NOTE — Consult Note (Signed)
[facility] Cardiology  CARDIOLOGY CONSULT NOTE  Patient ID: Justin Donaldson MRN: [id number] DOB/AGE: July 20, 1975 41 y.o.  Admit date: 12/05/2016 Referring Physician Allena Katz Primary Physician  Primary Cardiologist  Reason for Consultation Chest pain  HPI: 41 year old gentleman referred for evaluation of chest pain. The patient has a history of traumatic brain injury and seizure disorder. He was in his usual state of health until approximately 2-3 days ago when he first started noticing intermittent chest discomfort with deep breathing. Symptoms progressed and became severe last night. He presented to [facility] emergency room where ECG revealed sinus rhythm with nondiagnostic ST elevations in leads 2, 3, aVF, V5 and V6. The patient reports sharp-like chest pain, with deep breaths, worse with cough, or laying flat in bed. Admission labs were notable for negative troponin less than 0.03. White count was 20,100. CT scan revealed no evidence for pulmonary embolus, with moderate pericardial effusion.  Review of systems complete and found to be negative unless listed above     Past Medical History:  Diagnosis Date  . Seizures (HCC)   . Traumatic brain injury [facility])     History reviewed. No pertinent surgical history.  Prescriptions Prior to Admission  Medication Sig Dispense Refill Last Dose  . divalproex (DEPAKOTE) 500 MG DR tablet Take 500 mg by mouth 2 (two) times daily.   12/04/2016 at Unknown time  . hydrOXYzine (ATARAX/VISTARIL) 50 MG tablet Take 50 mg by mouth every 6 (six) hours as needed.   PRN at PRN  . sertraline (ZOLOFT) 100 MG tablet Take 200 mg by mouth daily.   12/04/2016 at Unknown time  . ketorolac (ACULAR) 0.5 % ophthalmic solution Place 1 drop into the left eye 4 (four) times daily. (Patient not taking: Reported on 12/05/2016) 5 mL 0 Not Taking at Unknown time  . propranolol (INDERAL) 10 MG tablet Take 10 mg by mouth 3 (three) times daily.   Not Taking at Unknown  . trimethoprim-polymyxin b  (POLYTRIM) ophthalmic solution Place 2 drops into the left eye every 6 (six) hours. (Patient not taking: Reported on 12/05/2016) 10 mL 0 Not Taking at Unknown time   Social History   Social History  . Marital status: Single    Spouse name: N/A  . Number of children: N/A  . Years of education: N/A   Occupational History  . Not on file.   Social History Main Topics  . Smoking status: Current Every Day Smoker    Packs/day: 1.00    Types: Cigarettes  . Smokeless tobacco: Never Used  . Alcohol use Yes     Comment: occ  . Drug use: No     Comment: pt states he quit smoking marijuana  . Sexual activity: Not on file   Other Topics Concern  . Not on file   Social History Narrative  . No narrative on file    Family History  Problem Relation Age of Onset  . CAD Father       Review of systems complete and found to be negative unless listed above      PHYSICAL EXAM  General: Well developed, well nourished, in no acute distress HEENT:  Normocephalic and atramatic Neck:  No JVD.  Lungs: Clear bilaterally to auscultation and percussion. Heart: HRRR . Normal S1 and S2 without gallops or murmurs.  Abdomen: Bowel sounds are positive, abdomen soft and non-tender  Msk:  Back normal, normal gait. Normal strength and tone for age. Extremities: No clubbing, cyanosis or edema.   Neuro: Alert and  oriented X 3. Psych:  Good affect, responds appropriately  Labs:   Lab Results  Component Value Date   WBC 20.1 (H) 12/05/2016   HGB 13.3 12/05/2016   HCT 38.6 (L) 12/05/2016   MCV 84.7 12/05/2016   PLT 359 12/05/2016    Recent Labs Lab 12/05/16 0925  NA 131*  K 4.5  CL 101  CO2 22  BUN 16  CREATININE 0.72  CALCIUM 8.8*  PROT 7.0  BILITOT 0.4  ALKPHOS 79  ALT 19  AST 17  GLUCOSE 124*   Lab Results  Component Value Date   TROPONINI <0.03 12/05/2016    Lab Results  Component Value Date   CHOL 162 12/05/2016   Lab Results  Component Value Date   HDL 39 (L)  12/05/2016   Lab Results  Component Value Date   LDLCALC 103 (H) 12/05/2016   Lab Results  Component Value Date   TRIG 102 12/05/2016   Lab Results  Component Value Date   CHOLHDL 4.2 12/05/2016   No results found for: LDLDIRECT    Radiology: Ct Angio Chest Pe W/cm &/or Wo Cm  Result Date: 12/05/2016 CLINICAL DATA:  Chest pain. EXAM: CT ANGIOGRAPHY CHEST CT ABDOMEN AND PELVIS WITH CONTRAST TECHNIQUE: Multidetector CT imaging of the chest was performed using the standard protocol during bolus administration of intravenous contrast. Multiplanar CT image reconstructions and MIPs were obtained to evaluate the vascular anatomy. Multidetector CT imaging of the abdomen and pelvis was performed using the standard protocol during bolus administration of intravenous contrast. CONTRAST:  100 cc Isovue 370 IV COMPARISON:  CT abdomen and pelvis 10/04/2005 FINDINGS: CTA CHEST FINDINGS Cardiovascular: No filling defects in the pulmonary arteries to suggest pulmonary emboli. Heart is normal size. Aorta is normal caliber. There is moderate pericardial effusion. Mediastinum/Nodes: Small scattered mediastinal lymph nodes, none pathologically enlarged. No mediastinal, hilar, or axillary adenopathy. Trachea and esophagus are unremarkable. Lungs/Pleura: Small bilateral pleural effusions. Mild to moderate centrilobular emphysema. Bibasilar atelectasis. Musculoskeletal: No acute bony abnormality. Review of the MIP images confirms the above findings. CT ABDOMEN and PELVIS FINDINGS Hepatobiliary: Periportal edema noted throughout the liver with heterogeneous enhancement pattern. Small cyst in the right hepatic lobe near the gallbladder fossa. Gallbladder unremarkable. Focal fatty infiltration near the falciform ligament. Pancreas: No focal abnormality or ductal dilatation. Spleen: No focal abnormality.  Normal size. Adrenals/Urinary Tract: No adrenal abnormality. No focal renal abnormality. No stones or hydronephrosis.  Urinary bladder is unremarkable. Stomach/Bowel: Stomach, large and small bowel grossly unremarkable. Appendix is normal. Vascular/Lymphatic: No evidence of aneurysm or adenopathy. Reproductive: No visible focal abnormality. Other: No free fluid or free air. Musculoskeletal: No acute bony abnormality. Review of the MIP images confirms the above findings. IMPRESSION: No evidence of pulmonary embolus. Moderate pericardial effusion. Small bilateral pleural effusions. Bibasilar atelectasis. Periportal edema noted within the liver. This can be seen with volume overload or intrinsic liver disease such is hepatitis. Electronically Signed   By: Charlett Nose M.D.   On: 12/05/2016 11:23   Ct Abdomen Pelvis W Contrast  Result Date: 12/05/2016 CLINICAL DATA:  Chest pain. EXAM: CT ANGIOGRAPHY CHEST CT ABDOMEN AND PELVIS WITH CONTRAST TECHNIQUE: Multidetector CT imaging of the chest was performed using the standard protocol during bolus administration of intravenous contrast. Multiplanar CT image reconstructions and MIPs were obtained to evaluate the vascular anatomy. Multidetector CT imaging of the abdomen and pelvis was performed using the standard protocol during bolus administration of intravenous contrast. CONTRAST:  100 cc Isovue 370 IV  COMPARISON:  CT abdomen and pelvis 10/04/2005 FINDINGS: CTA CHEST FINDINGS Cardiovascular: No filling defects in the pulmonary arteries to suggest pulmonary emboli. Heart is normal size. Aorta is normal caliber. There is moderate pericardial effusion. Mediastinum/Nodes: Small scattered mediastinal lymph nodes, none pathologically enlarged. No mediastinal, hilar, or axillary adenopathy. Trachea and esophagus are unremarkable. Lungs/Pleura: Small bilateral pleural effusions. Mild to moderate centrilobular emphysema. Bibasilar atelectasis. Musculoskeletal: No acute bony abnormality. Review of the MIP images confirms the above findings. CT ABDOMEN and PELVIS FINDINGS Hepatobiliary: Periportal  edema noted throughout the liver with heterogeneous enhancement pattern. Small cyst in the right hepatic lobe near the gallbladder fossa. Gallbladder unremarkable. Focal fatty infiltration near the falciform ligament. Pancreas: No focal abnormality or ductal dilatation. Spleen: No focal abnormality.  Normal size. Adrenals/Urinary Tract: No adrenal abnormality. No focal renal abnormality. No stones or hydronephrosis. Urinary bladder is unremarkable. Stomach/Bowel: Stomach, large and small bowel grossly unremarkable. Appendix is normal. Vascular/Lymphatic: No evidence of aneurysm or adenopathy. Reproductive: No visible focal abnormality. Other: No free fluid or free air. Musculoskeletal: No acute bony abnormality. Review of the MIP images confirms the above findings. IMPRESSION: No evidence of pulmonary embolus. Moderate pericardial effusion. Small bilateral pleural effusions. Bibasilar atelectasis. Periportal edema noted within the liver. This can be seen with volume overload or intrinsic liver disease such is hepatitis. Electronically Signed   By: Charlett Nose M.D.   On: 12/05/2016 11:23   Dg Chest Port 1 View  Result Date: 12/05/2016 CLINICAL DATA:  Chest pain. EXAM: PORTABLE CHEST 1 VIEW COMPARISON:  09/14/2015 FINDINGS: Heart size and pulmonary vascularity are normal. Minimal atelectasis at the left lung base. Lungs are otherwise clear. Bones are normal. IMPRESSION: Minimal atelectasis at the left lung base. This is probably due to a shallow inspiration. Electronically Signed   By: Francene Boyers M.D.   On: 12/05/2016 09:42    EKG: Normal sinus rhythm  ASSESSMENT AND PLAN:   1. Chest pain, atypical in nature, pleuritic, in the setting of elevated white count, and nondiagnostic ECG, likely not STEMI or acute coronary syndrome  Recommendations  1. Agree with overall current therapy 2. Agree with empiric therapy for pericarditis 3. 2-D echocardiogram to better delineate pericardial effusion 4.  Defer full dose anticoagulation 5. Defer cardiac catheterization at this time  Signed: Kaipo Ardis MD,PhD, [facility] 12/05/2016, 3:15 PM

## 2016-12-05 NOTE — ED Provider Notes (Signed)
[facility] Emergency Department Provider Note  ____________________________________________   First MD Initiated Contact with Patient 12/05/16 [id number]     (approximate)  I have reviewed the triage vital signs and the nursing notes.   HISTORY  Chief Complaint Chest Pain  Level V exemption history is limited by the patient's clinical condition  HPI Justin Donaldson is a 41 y.o. male who comes to the emergency department via EMS with chest pain. The patient is a challenging historian secondary to severe pain and tells me that his pain began roughly 24 hours ago and has been constant since. It is in his lower chest and upper abdomen feels like searing and radiates from his central chest to his back neck and bilateral shoulders. His only past medical history seizure disorder for which he takes Depakote. He has never had a heart attack. He has never had a stroke. He did not receive nitroglycerin or aspirin en route.   Past Medical History:  Diagnosis Date  . Seizures (HCC)   . Traumatic brain injury (HCC)     There are no active problems to display for this patient.   History reviewed. No pertinent surgical history.  Prior to Admission medications   Medication Sig Start Date End Date Taking? Authorizing Provider  divalproex (DEPAKOTE) 500 MG DR tablet Take 500 mg by mouth 2 (two) times daily.   Yes [provider]  hydrOXYzine (ATARAX/VISTARIL) 50 MG tablet Take 50 mg by mouth every 6 (six) hours as needed. 08/06/16  Yes [provider]  sertraline (ZOLOFT) 100 MG tablet Take 200 mg by mouth daily. 10/23/16 10/23/17 Yes [provider]  ketorolac (ACULAR) 0.5 % ophthalmic solution Place 1 drop into the left eye 4 (four) times daily. Patient not taking: Reported on 12/05/2016 07/23/16   Cuthriell, Delorise Royals, PA-C  propranolol (INDERAL) 10 MG tablet Take 10 mg by mouth 3 (three) times daily. 05/28/16   [provider]    trimethoprim-polymyxin b (POLYTRIM) ophthalmic solution Place 2 drops into the left eye every 6 (six) hours. Patient not taking: Reported on 12/05/2016 07/23/16   Cuthriell, Delorise Royals, PA-C    Allergies Patient has no known allergies.  History reviewed. No pertinent family history.  Social History Social History  Substance Use Topics  . Smoking status: Current Every Day Smoker    Packs/day: 1.00    Types: Cigarettes  . Smokeless tobacco: Never Used  . Alcohol use Yes     Comment: occ    Review of Systems Constitutional: No fever/chills Eyes: No visual changes. ENT: No sore throat. Cardiovascular: Positive chest pain. Respiratory: Positive shortness of breath. Gastrointestinal: Positive abdominal pain.  Positive nausea, no vomiting.  No diarrhea.  No constipation. Genitourinary: Negative for dysuria. Musculoskeletal: Negative for back pain. Skin: Negative for rash. Neurological: Negative for headaches, focal weakness or numbness.   ____________________________________________   PHYSICAL EXAM:  VITAL SIGNS: ED Triage Vitals  Enc Vitals Group     BP      Pulse      Resp      Temp      Temp src      SpO2      Weight      Height      Head Circumference      Peak Flow      Pain Score      Pain Loc      Pain Edu?      Excl. in GC?  Constitutional: Alert and oriented 4 appears extremely uncomfortable diaphoretic taking shallow breaths Eyes: PERRL EOMI. Head: Atraumatic. Nose: No congestion/rhinnorhea. Mouth/Throat: No trismus Neck: No stridor.   Cardiovascular: Tachycardic rate, regular rhythm. Grossly normal heart sounds.  Good peripheral circulation. Respiratory: Increased respiratory effort taking shallow breaths Gastrointestinal: Extremely tender abdomen diffusely Musculoskeletal: No lower extremity edema   Neurologic:  Normal speech and language. No gross focal neurologic deficits are appreciated. Skin:  Diaphoretic Psychiatric: Mood and affect  are normal. Speech and behavior are normal.    ____________________________________________   DIFFERENTIAL includes but not limited to  STEMI, non-STEMI, pulmonary embolism, pericarditis, perforated viscus, pancreatitis ____________________________________________   LABS (all labs ordered are listed, but only abnormal results are displayed)  Labs Reviewed  CBC - Abnormal; Notable for the following:       Result Value   WBC 20.1 (*)    HCT 38.6 (*)    RDW 14.7 (*)    All other components within normal limits  COMPREHENSIVE METABOLIC PANEL - Abnormal; Notable for the following:    Sodium 131 (*)    Glucose, Bld 124 (*)    Calcium 8.8 (*)    Albumin 3.4 (*)    All other components within normal limits  LIPID PANEL - Abnormal; Notable for the following:    HDL 39 (*)    LDL Cholesterol 103 (*)    All other components within normal limits  TROPONIN I  PROTIME-INR  APTT  LIPASE, BLOOD    First troponin is negative elevated white count is nonspecific __________________________________________  EKG  ED ECG REPORT I, Merrily Brittle, the attending physician, personally viewed and interpreted this ECG.  Date: 12/05/2016 EKG Time: 0919 Rate: 114 Rhythm: Sinus tachycardia QRS Axis: normal Intervals: normal ST/T Wave abnormalities: ST elevation in leads 3 and aVF as well as V6 and a bit in V5 as well as ST depression in aVL raises the concern for ST elevation myocardial infarction Narrative Interpretation: Abnormal  ED ECG REPORT I, Merrily Brittle, the attending physician, personally viewed and interpreted this ECG.  Date: 12/05/2016 EKG Time: 43154 Rate: 109 Rhythm: Sinus tachycardia QRS Axis: normal Intervals: normal ST/T Wave abnormalities: Concave up ST elevation in leads 23 aVF and V5 V6  Narrative Interpretation: Abnormal but no reciprocal changes any longer could be persistent myocardial ischemia versus  pericarditis  ____________________________________________  RADIOLOGY  Chest x-ray with left lower atelectasis CT NGO with no pulmonary embolism but does show moderate pericardial effusion ____________________________________________   PROCEDURES  Procedure(s) performed: no  Procedures  Critical Care performed:yes  CRITICAL CARE Performed by: Merrily Brittle   Total critical care time: 35 minutes  Critical care time was exclusive of separately billable procedures and treating other patients.  Critical care was necessary to treat or prevent imminent or life-threatening deterioration.  Critical care was time spent personally by me on the following activities: development of treatment plan with patient and/or surrogate as well as nursing, discussions with consultants, evaluation of patient's response to treatment, examination of patient, obtaining history from patient or surrogate, ordering and performing treatments and interventions, ordering and review of laboratory studies, ordering and review of radiographic studies, pulse oximetry and re-evaluation of patient's condition.   Observation: no ____________________________________________   INITIAL IMPRESSION / ASSESSMENT AND PLAN / ED COURSE  Pertinent labs & imaging results that were available during my care of the patient were reviewed by me and considered in my medical decision making (see chart for details).  On arrival the patient  is tachycardic diaphoretic and ill appearing. His story is not classic for acute coronary syndrome however his EKG is extremely concerning. He does have diffuse ST elevation although with ST depression in aVL I doubt pericarditis. Code STEMI called.     ----------------------------------------- 10:01 AM on 12/05/2016 ----------------------------------------- Dr. Darrold Junker came and evaluated the patient. He offered the patient cardiac catheterization however the patient declined. At the time  the patient declined the patient's troponin was not yet back. At this point it is back and is negative. He still does have diffuse ST elevation concerning for possible pulmonary embolism given the pleuritic nature of his pain. We will give a trial of nitroglycerin now as well as a CT pulmonary angiogram. He is now also reporting abdominal pain with a tender abdomen. I am adding on a lipase and we will CT his abdomen pelvis as well as the chest. His pain is not improved with nitroglycerin.  ____________________________________________  ----------------------------------------- 11:41 AM on 12/05/2016 -----------------------------------------  Repeat EKG done just now sinus tachycardia to 109 with mild diffuse concave up ST elevation as well as PR elevation in aVR and now what appears to be some PR depression inferiorly this may very well represent pericarditis at this point especially in the setting of a pericardial effusion. Regardless given the uncertain nature of the pain and the previous EKG concerning for myocardial ischemia he will still require inpatient admission.   FINAL CLINICAL IMPRESSION(S) / ED DIAGNOSES  Final diagnoses:  Chest pain, unspecified type  Pericardial effusion      NEW MEDICATIONS STARTED DURING THIS VISIT:  New Prescriptions   No medications on file     Note:  This document was prepared using Dragon voice recognition software and may include unintentional dictation errors.     Merrily Brittle, MD 12/05/16 1143

## 2016-12-05 NOTE — H&P (Signed)
[place] [facility] [place] NAME: Justin Donaldson    MR#:  [id number]  DATE OF BIRTH:  17-May-1975  DATE OF ADMISSION:  12/05/2016  PRIMARY CARE PHYSICIAN: Patient, No Pcp Per   REQUESTING/REFERRING PHYSICIAN: Darel Hong MD  CHIEF COMPLAINT:   Chief Complaint  Patient presents with  . Chest Pain    HISTORY OF PRESENT ILLNESS: Justin Donaldson  is a 42 y.o. male with a known history of  Seizures and nicotine abuse who is presenting with complaint of sharp chest pain. He reports that he started 24 hours ago. He states that is worse with deep breathing. He states that he gets short of breath gets taken away when he takes deep breath. Initially there was concern that he may have acute coronary syndrome cardiology evaluated the patient and did not feel that this was an acute coronary syndrome. CT scan of the chest shows fluid around the heart. He denies any fevers chills  PAST MEDICAL HISTORY:   Past Medical History:  Diagnosis Date  . Seizures ([place])   . Traumatic brain injury ([place])     PAST SURGICAL HISTORY: History reviewed. No pertinent surgical history.  SOCIAL HISTORY:  Social History  Substance Use Topics  . Smoking status: Current Every Day Smoker    Packs/day: 1.00    Types: Cigarettes  . Smokeless tobacco: Never Used  . Alcohol use Yes     Comment: occ    FAMILY HISTORY:  Family History  Problem Relation Age of Onset  . CAD Father     DRUG ALLERGIES: No Known Allergies  REVIEW OF SYSTEMS:   CONSTITUTIONAL: No fever, fatigue or weakness.  EYES: No blurred or double vision.  EARS, NOSE, AND THROAT: No tinnitus or ear pain.  RESPIRATORY: No cough, shortness of breath, wheezing or hemoptysis.  CARDIOVASCULAR: positive chest pain, orthopnea, edema.  GASTROINTESTINAL: No nausea, vomiting, diarrhea or abdominal pain.  GENITOURINARY: No dysuria, hematuria.  ENDOCRINE: No polyuria, nocturia,  HEMATOLOGY: No anemia, easy  bruising or bleeding SKIN: No rash or lesion. MUSCULOSKELETAL: No joint pain or arthritis.   NEUROLOGIC: No tingling, numbness, weakness.  PSYCHIATRY: No anxiety or depression.   MEDICATIONS AT HOME:  Prior to Admission medications   Medication Sig Start Date End Date Taking? Authorizing Provider  divalproex (DEPAKOTE) 500 MG DR tablet Take 500 mg by mouth 2 (two) times daily.   Yes [provider]  hydrOXYzine (ATARAX/VISTARIL) 50 MG tablet Take 50 mg by mouth every 6 (six) hours as needed. 08/06/16  Yes [provider]  sertraline (ZOLOFT) 100 MG tablet Take 200 mg by mouth daily. 10/23/16 10/23/17 Yes [provider]  ketorolac (ACULAR) 0.5 % ophthalmic solution Place 1 drop into the left eye 4 (four) times daily. Patient not taking: Reported on 12/05/2016 07/23/16   Cuthriell, Charline Bills, PA-C  propranolol (INDERAL) 10 MG tablet Take 10 mg by mouth 3 (three) times daily. 05/28/16   [provider]  trimethoprim-polymyxin b (POLYTRIM) ophthalmic solution Place 2 drops into the left eye every 6 (six) hours. Patient not taking: Reported on 12/05/2016 07/23/16   Cuthriell, Charline Bills, PA-C      PHYSICAL EXAMINATION:   VITAL SIGNS: Blood pressure 95/79, pulse (!) 104, temperature 98.5 F (36.9 C), temperature source Oral, resp. rate (!) 22, height 5' 10"  (1.778 m), weight 152 lb (68.9 kg), SpO2 97 %.  GENERAL:  40 y.o.-year-old patient lying in the bed with no acute distress.  EYES: Pupils  equal, round, reactive to light and accommodation. No scleral icterus. Extraocular muscles intact.  HEENT: Head atraumatic, normocephalic. Oropharynx and nasopharynx clear.  NECK:  Supple, no jugular venous distention. No thyroid enlargement, no tenderness.  LUNGS: Normal breath sounds bilaterally, no wheezing, rales,rhonchi or crepitation. No use of accessory muscles of respiration.  CARDIOVASCULAR: S1, S2 normal. No murmurs, rubs, or gallops.  ABDOMEN: Soft, nontender,  nondistended. Bowel sounds present. No organomegaly or mass.  EXTREMITIES: No pedal edema, cyanosis, or clubbing.  NEUROLOGIC: Cranial nerves II through XII are intact. Muscle strength 5/5 in all extremities. Sensation intact. Gait not checked.  PSYCHIATRIC: The patient is alert and oriented x 3.  SKIN: No obvious rash, lesion, or ulcer.   LABORATORY PANEL:   CBC  Recent Labs Lab 12/05/16 0924  WBC 20.1*  HGB 13.3  HCT 38.6*  PLT 359  MCV 84.7  MCH 29.2  MCHC 34.5  RDW 14.7*   ------------------------------------------------------------------------------------------------------------------  Chemistries   Recent Labs Lab 12/05/16 0925  NA 131*  K 4.5  CL 101  CO2 22  GLUCOSE 124*  BUN 16  CREATININE 0.72  CALCIUM 8.8*  AST 17  ALT 19  ALKPHOS 79  BILITOT 0.4   ------------------------------------------------------------------------------------------------------------------ estimated creatinine clearance is 118.4 mL/min (by C-G formula based on SCr of 0.72 mg/dL). ------------------------------------------------------------------------------------------------------------------ No results for input(s): TSH, T4TOTAL, T3FREE, THYROIDAB in the last 72 hours.  Invalid input(s): FREET3   Coagulation profile  Recent Labs Lab 12/05/16 0924  INR 0.98   ------------------------------------------------------------------------------------------------------------------- No results for input(s): DDIMER in the last 72 hours. -------------------------------------------------------------------------------------------------------------------  Cardiac Enzymes  Recent Labs Lab 12/05/16 0924  TROPONINI <0.03   ------------------------------------------------------------------------------------------------------------------ Invalid input(s):  POCBNP  ---------------------------------------------------------------------------------------------------------------  Urinalysis    Component Value Date/Time   COLORURINE YELLOW (A) 09/14/2015 1755   APPEARANCEUR CLEAR (A) 09/14/2015 1755   LABSPEC 1.028 09/14/2015 1755   PHURINE 5.0 09/14/2015 1755   GLUCOSEU NEGATIVE 09/14/2015 1755   HGBUR NEGATIVE 09/14/2015 1755   BILIRUBINUR NEGATIVE 09/14/2015 1755   KETONESUR NEGATIVE 09/14/2015 1755   PROTEINUR NEGATIVE 09/14/2015 1755   NITRITE NEGATIVE 09/14/2015 1755   LEUKOCYTESUR NEGATIVE 09/14/2015 1755     RADIOLOGY: Ct Angio Chest Pe W/cm &/or Wo Cm  Result Date: 12/05/2016 CLINICAL DATA:  Chest pain. EXAM: CT ANGIOGRAPHY CHEST CT ABDOMEN AND PELVIS WITH CONTRAST TECHNIQUE: Multidetector CT imaging of the chest was performed using the standard protocol during bolus administration of intravenous contrast. Multiplanar CT image reconstructions and MIPs were obtained to evaluate the vascular anatomy. Multidetector CT imaging of the abdomen and pelvis was performed using the standard protocol during bolus administration of intravenous contrast. CONTRAST:  100 cc Isovue 370 IV COMPARISON:  CT abdomen and pelvis 10/04/2005 FINDINGS: CTA CHEST FINDINGS Cardiovascular: No filling defects in the pulmonary arteries to suggest pulmonary emboli. Heart is normal size. Aorta is normal caliber. There is moderate pericardial effusion. Mediastinum/Nodes: Small scattered mediastinal lymph nodes, none pathologically enlarged. No mediastinal, hilar, or axillary adenopathy. Trachea and esophagus are unremarkable. Lungs/Pleura: Small bilateral pleural effusions. Mild to moderate centrilobular emphysema. Bibasilar atelectasis. Musculoskeletal: No acute bony abnormality. Review of the MIP images confirms the above findings. CT ABDOMEN and PELVIS FINDINGS Hepatobiliary: Periportal edema noted throughout the liver with heterogeneous enhancement pattern. Small cyst  in the right hepatic lobe near the gallbladder fossa. Gallbladder unremarkable. Focal fatty infiltration near the falciform ligament. Pancreas: No focal abnormality or ductal dilatation. Spleen: No focal abnormality.  Normal size. Adrenals/Urinary Tract: No adrenal abnormality. No focal renal abnormality.  No stones or hydronephrosis. Urinary bladder is unremarkable. Stomach/Bowel: Stomach, large and small bowel grossly unremarkable. Appendix is normal. Vascular/Lymphatic: No evidence of aneurysm or adenopathy. Reproductive: No visible focal abnormality. Other: No free fluid or free air. Musculoskeletal: No acute bony abnormality. Review of the MIP images confirms the above findings. IMPRESSION: No evidence of pulmonary embolus. Moderate pericardial effusion. Small bilateral pleural effusions. Bibasilar atelectasis. Periportal edema noted within the liver. This can be seen with volume overload or intrinsic liver disease such is hepatitis. Electronically Signed   By: Rolm Baptise M.D.   On: 12/05/2016 11:23   Ct Abdomen Pelvis W Contrast  Result Date: 12/05/2016 CLINICAL DATA:  Chest pain. EXAM: CT ANGIOGRAPHY CHEST CT ABDOMEN AND PELVIS WITH CONTRAST TECHNIQUE: Multidetector CT imaging of the chest was performed using the standard protocol during bolus administration of intravenous contrast. Multiplanar CT image reconstructions and MIPs were obtained to evaluate the vascular anatomy. Multidetector CT imaging of the abdomen and pelvis was performed using the standard protocol during bolus administration of intravenous contrast. CONTRAST:  100 cc Isovue 370 IV COMPARISON:  CT abdomen and pelvis 10/04/2005 FINDINGS: CTA CHEST FINDINGS Cardiovascular: No filling defects in the pulmonary arteries to suggest pulmonary emboli. Heart is normal size. Aorta is normal caliber. There is moderate pericardial effusion. Mediastinum/Nodes: Small scattered mediastinal lymph nodes, none pathologically enlarged. No mediastinal,  hilar, or axillary adenopathy. Trachea and esophagus are unremarkable. Lungs/Pleura: Small bilateral pleural effusions. Mild to moderate centrilobular emphysema. Bibasilar atelectasis. Musculoskeletal: No acute bony abnormality. Review of the MIP images confirms the above findings. CT ABDOMEN and PELVIS FINDINGS Hepatobiliary: Periportal edema noted throughout the liver with heterogeneous enhancement pattern. Small cyst in the right hepatic lobe near the gallbladder fossa. Gallbladder unremarkable. Focal fatty infiltration near the falciform ligament. Pancreas: No focal abnormality or ductal dilatation. Spleen: No focal abnormality.  Normal size. Adrenals/Urinary Tract: No adrenal abnormality. No focal renal abnormality. No stones or hydronephrosis. Urinary bladder is unremarkable. Stomach/Bowel: Stomach, large and small bowel grossly unremarkable. Appendix is normal. Vascular/Lymphatic: No evidence of aneurysm or adenopathy. Reproductive: No visible focal abnormality. Other: No free fluid or free air. Musculoskeletal: No acute bony abnormality. Review of the MIP images confirms the above findings. IMPRESSION: No evidence of pulmonary embolus. Moderate pericardial effusion. Small bilateral pleural effusions. Bibasilar atelectasis. Periportal edema noted within the liver. This can be seen with volume overload or intrinsic liver disease such is hepatitis. Electronically Signed   By: Rolm Baptise M.D.   On: 12/05/2016 11:23   Dg Chest Port 1 View  Result Date: 12/05/2016 CLINICAL DATA:  Chest pain. EXAM: PORTABLE CHEST 1 VIEW COMPARISON:  09/14/2015 FINDINGS: Heart size and pulmonary vascularity are normal. Minimal atelectasis at the left lung base. Lungs are otherwise clear. Bones are normal. IMPRESSION: Minimal atelectasis at the left lung base. This is probably due to a shallow inspiration. Electronically Signed   By: Lorriane Shire M.D.   On: 12/05/2016 09:42    EKG: Orders placed or performed during the  hospital encounter of 12/05/16  . EKG 12-Lead  . EKG 12-Lead  . ED EKG within 10 minutes  . ED EKG within 10 minutes  . Repeat EKG  . Repeat EKG  . ED EKG  . ED EKG  . EKG 12-Lead  . EKG 12-Lead  . EKG 12-Lead  . EKG 12-Lead  . Repeat EKG  . Repeat EKG  . EKG 12-Lead  . EKG 12-Lead    IMPRESSION AND PLAN: Patient is 41 year old presenting  with sharp chest pain  1. Chest pain with pericardial effusion noted on the CT I have concern for pericarditis We'll obtain echocardiogram of the heart Check ESR Check ana Start patient on colchicine and indomethacin  2. Leukocytosis suspect related to above I will check pro calcitonin level obtained blood cultures  3. Seizure disorder continue his home regimen  4. Nicotine abuse smoking cessation provided 4 min spent strongly recommended he stop smoking  5. Miscellaneous heparin for DVT prophylaxis   All the records are reviewed and case discussed with ED provider. Management plans discussed with the patient, family and they are in agreement.  CODE STATUS: Code Status History    This patient does not have a recorded code status. Please follow your organizational policy for patients in this situation.       TOTAL TIME TAKING CARE OF THIS PATIENT: 31mnutes.    PDustin FlockM.D on 12/05/2016 at 12:23 PM  Between 7am to 6pm - Pager - [phone]  After 6pm go to [email or web address] - password [email or web address]  Office  [phone] CC: Primary care physician; Patient, No Pcp Per

## 2016-12-05 NOTE — ED Triage Notes (Signed)
PAtient presents with chest pain that started last night and progressively got worse through today. Pain radiates from central chest to back, neck, and shoulders. Patient grimacing non stop with jabbing pains through chest

## 2016-12-05 NOTE — ED Notes (Signed)
Dr Kern Reap at bedside.

## 2016-12-05 NOTE — Progress Notes (Signed)
*  PRELIMINARY RESULTS* Echocardiogram 2D Echocardiogram has been performed.  Cristela Blue 12/05/2016, 3:36 PM

## 2016-12-05 NOTE — ED Notes (Signed)
Patient placed on 2L O2 

## 2016-12-05 NOTE — Progress Notes (Signed)
Chaplain responded to Code STEMI for pt in ED26. [place] met pt as he was dc and nurse were evaluating him. No family member was present. However, pt states the his girlfriend and brother were on the way to hospital. [facility] provide pastoral care and a ministry of presence. Justin Donaldson to follow up with pt as needed.    12/05/16 1000  Clinical Encounter Type  Visited With Patient;Health care provider  Visit Type Initial;Other (Comment)  Referral From Nurse  Consult/Referral To Chaplain  Spiritual Encounters  Spiritual Needs Other (Comment)

## 2016-12-06 LAB — CBC
HCT: 36.6 % — ABNORMAL LOW (ref 40.0–52.0)
HEMOGLOBIN: 12.5 g/dL — AB (ref 13.0–18.0)
MCH: 29.4 pg (ref 26.0–34.0)
MCHC: 34.1 g/dL (ref 32.0–36.0)
MCV: 86.2 fL (ref 80.0–100.0)
Platelets: 327 10*3/uL (ref 150–440)
RBC: 4.25 MIL/uL — AB (ref 4.40–5.90)
RDW: 14.9 % — ABNORMAL HIGH (ref 11.5–14.5)
WBC: 16.9 10*3/uL — ABNORMAL HIGH (ref 3.8–10.6)

## 2016-12-06 LAB — HIV ANTIBODY (ROUTINE TESTING W REFLEX): HIV SCREEN 4TH GENERATION: NONREACTIVE

## 2016-12-06 LAB — BASIC METABOLIC PANEL
Anion gap: 8 (ref 5–15)
BUN: 19 mg/dL (ref 6–20)
CHLORIDE: 105 mmol/L (ref 101–111)
CO2: 22 mmol/L (ref 22–32)
CREATININE: 0.74 mg/dL (ref 0.61–1.24)
Calcium: 8.3 mg/dL — ABNORMAL LOW (ref 8.9–10.3)
GFR calc non Af Amer: >60 mL/min (ref >60)
Glucose, Bld: 111 mg/dL — ABNORMAL HIGH (ref 65–99)
POTASSIUM: 4.5 mmol/L (ref 3.5–5.1)
SODIUM: 135 mmol/L (ref 135–145)

## 2016-12-06 LAB — TROPONIN I

## 2016-12-06 LAB — ANA COMPREHENSIVE PANEL
Anti JO-1: <0.2 AI (ref 0.0–0.9)
Centromere Ab Screen: <0.2 AI (ref 0.0–0.9)
Chromatin Ab SerPl-aCnc: <0.2 AI (ref 0.0–0.9)
ENA SM Ab Ser-aCnc: <0.2 AI (ref 0.0–0.9)
Ribonucleic Protein: <0.2 AI (ref 0.0–0.9)
SSA (Ro) (ENA) Antibody, IgG: 0.2 AI (ref 0.0–0.9)
SSB (La) (ENA) Antibody, IgG: <0.2 AI (ref 0.0–0.9)
Scleroderma (Scl-70) (ENA) Antibody, IgG: <0.2 AI (ref 0.0–0.9)
ds DNA Ab: 1 [IU]/mL (ref 0–9)

## 2016-12-06 MED ORDER — CALCIUM CARBONATE ANTACID 500 MG PO CHEW
1.0000 | CHEWABLE_TABLET | Freq: Three times a day (TID) | ORAL | Status: DC | PRN
Start: 2016-12-06 — End: 2016-12-07
  Administered 2016-12-06: 200 mg via ORAL
  Filled 2016-12-06: qty 1

## 2016-12-06 NOTE — Progress Notes (Signed)
[facility] [place] NAME: Justin Donaldson    MR#:  [id number]  DATE OF BIRTH:  10-17-1975  SUBJECTIVE:   Patient presents with pleuritic chest pain which has improved since admission.  REVIEW OF SYSTEMS:    Review of Systems  Constitutional: Negative for fever, chills weight loss HENT: Negative for ear pain, nosebleeds, congestion, facial swelling, rhinorrhea, neck pain, neck stiffness and ear discharge.   Respiratory: Negative for cough, shortness of breath(Has improved), no wheezing  Cardiovascular: Positive pleuritic chest pain  Gastrointestinal: Negative for heartburn, abdominal pain, vomiting, diarrhea or consitpation Genitourinary: Negative for dysuria, urgency, frequency, hematuria Musculoskeletal: Negative for back pain or joint pain Neurological: Negative for dizziness, seizures, syncope, focal weakness,  numbness and headaches.  Hematological: Does not bruise/bleed easily.  Psychiatric/Behavioral: Negative for hallucinations, confusion, dysphoric mood    Tolerating Diet: yes      DRUG ALLERGIES:  No Known Allergies  VITALS:  Blood pressure (!) 136/97, pulse 97, temperature 98.1 F (36.7 C), temperature source Oral, resp. rate 18, height 5' 10"  (1.778 m), weight 68.9 kg (152 lb), SpO2 96 %.  PHYSICAL EXAMINATION:  Constitutional: Appears well-developed and well-nourished. No distress. HENT: Normocephalic. Marland Kitchen Oropharynx is clear and moist.  Eyes: Conjunctivae and EOM are normal. PERRLA, no scleral icterus.  Neck: Normal ROM. Neck supple. No JVD. No tracheal deviation. CVS: RRR, S1/S2 +, no murmurs, no gallops, no carotid bruit.  Pulmonary: Effort and breath sounds normal, no stridor, rhonchi, wheezes, rales.  Abdominal: Soft. BS +,  no distension, tenderness, rebound or guarding.  Musculoskeletal: Normal range of motion. No edema and no tenderness.  Neuro: Alert. CN 2-12 grossly intact. No focal deficits. Skin: Skin  is warm and dry. No rash noted. Psychiatric: Normal mood and affect.      LABORATORY PANEL:   CBC  Recent Labs Lab 12/06/16 0143  WBC 16.9*  HGB 12.5*  HCT 36.6*  PLT 327   ------------------------------------------------------------------------------------------------------------------  Chemistries   Recent Labs Lab 12/05/16 0925 12/06/16 0143  NA 131* 135  K 4.5 4.5  CL 101 105  CO2 22 22  GLUCOSE 124* 111*  BUN 16 19  CREATININE 0.72 0.74  CALCIUM 8.8* 8.3*  AST 17  --   ALT 19  --   ALKPHOS 79  --   BILITOT 0.4  --    ------------------------------------------------------------------------------------------------------------------  Cardiac Enzymes  Recent Labs Lab 12/05/16 0924 12/05/16 1559 12/06/16 0143  TROPONINI <0.03 <0.03 <0.03   ------------------------------------------------------------------------------------------------------------------  RADIOLOGY:  Ct Angio Chest Pe W/cm &/or Wo Cm  Result Date: 12/05/2016 CLINICAL DATA:  Chest pain. EXAM: CT ANGIOGRAPHY CHEST CT ABDOMEN AND PELVIS WITH CONTRAST TECHNIQUE: Multidetector CT imaging of the chest was performed using the standard protocol during bolus administration of intravenous contrast. Multiplanar CT image reconstructions and MIPs were obtained to evaluate the vascular anatomy. Multidetector CT imaging of the abdomen and pelvis was performed using the standard protocol during bolus administration of intravenous contrast. CONTRAST:  100 cc Isovue 370 IV COMPARISON:  CT abdomen and pelvis 10/04/2005 FINDINGS: CTA CHEST FINDINGS Cardiovascular: No filling defects in the pulmonary arteries to suggest pulmonary emboli. Heart is normal size. Aorta is normal caliber. There is moderate pericardial effusion. Mediastinum/Nodes: Small scattered mediastinal lymph nodes, none pathologically enlarged. No mediastinal, hilar, or axillary adenopathy. Trachea and esophagus are unremarkable. Lungs/Pleura: Small  bilateral pleural effusions. Mild to moderate centrilobular emphysema. Bibasilar atelectasis. Musculoskeletal: No acute bony abnormality. Review of the MIP images confirms the  above findings. CT ABDOMEN and PELVIS FINDINGS Hepatobiliary: Periportal edema noted throughout the liver with heterogeneous enhancement pattern. Small cyst in the right hepatic lobe near the gallbladder fossa. Gallbladder unremarkable. Focal fatty infiltration near the falciform ligament. Pancreas: No focal abnormality or ductal dilatation. Spleen: No focal abnormality.  Normal size. Adrenals/Urinary Tract: No adrenal abnormality. No focal renal abnormality. No stones or hydronephrosis. Urinary bladder is unremarkable. Stomach/Bowel: Stomach, large and small bowel grossly unremarkable. Appendix is normal. Vascular/Lymphatic: No evidence of aneurysm or adenopathy. Reproductive: No visible focal abnormality. Other: No free fluid or free air. Musculoskeletal: No acute bony abnormality. Review of the MIP images confirms the above findings. IMPRESSION: No evidence of pulmonary embolus. Moderate pericardial effusion. Small bilateral pleural effusions. Bibasilar atelectasis. Periportal edema noted within the liver. This can be seen with volume overload or intrinsic liver disease such is hepatitis. Electronically Signed   By: Rolm Baptise M.D.   On: 12/05/2016 11:23   Ct Abdomen Pelvis W Contrast  Result Date: 12/05/2016 CLINICAL DATA:  Chest pain. EXAM: CT ANGIOGRAPHY CHEST CT ABDOMEN AND PELVIS WITH CONTRAST TECHNIQUE: Multidetector CT imaging of the chest was performed using the standard protocol during bolus administration of intravenous contrast. Multiplanar CT image reconstructions and MIPs were obtained to evaluate the vascular anatomy. Multidetector CT imaging of the abdomen and pelvis was performed using the standard protocol during bolus administration of intravenous contrast. CONTRAST:  100 cc Isovue 370 IV COMPARISON:  CT abdomen and  pelvis 10/04/2005 FINDINGS: CTA CHEST FINDINGS Cardiovascular: No filling defects in the pulmonary arteries to suggest pulmonary emboli. Heart is normal size. Aorta is normal caliber. There is moderate pericardial effusion. Mediastinum/Nodes: Small scattered mediastinal lymph nodes, none pathologically enlarged. No mediastinal, hilar, or axillary adenopathy. Trachea and esophagus are unremarkable. Lungs/Pleura: Small bilateral pleural effusions. Mild to moderate centrilobular emphysema. Bibasilar atelectasis. Musculoskeletal: No acute bony abnormality. Review of the MIP images confirms the above findings. CT ABDOMEN and PELVIS FINDINGS Hepatobiliary: Periportal edema noted throughout the liver with heterogeneous enhancement pattern. Small cyst in the right hepatic lobe near the gallbladder fossa. Gallbladder unremarkable. Focal fatty infiltration near the falciform ligament. Pancreas: No focal abnormality or ductal dilatation. Spleen: No focal abnormality.  Normal size. Adrenals/Urinary Tract: No adrenal abnormality. No focal renal abnormality. No stones or hydronephrosis. Urinary bladder is unremarkable. Stomach/Bowel: Stomach, large and small bowel grossly unremarkable. Appendix is normal. Vascular/Lymphatic: No evidence of aneurysm or adenopathy. Reproductive: No visible focal abnormality. Other: No free fluid or free air. Musculoskeletal: No acute bony abnormality. Review of the MIP images confirms the above findings. IMPRESSION: No evidence of pulmonary embolus. Moderate pericardial effusion. Small bilateral pleural effusions. Bibasilar atelectasis. Periportal edema noted within the liver. This can be seen with volume overload or intrinsic liver disease such is hepatitis. Electronically Signed   By: Rolm Baptise M.D.   On: 12/05/2016 11:23   Dg Chest Port 1 View  Result Date: 12/05/2016 CLINICAL DATA:  Chest pain. EXAM: PORTABLE CHEST 1 VIEW COMPARISON:  09/14/2015 FINDINGS: Heart size and pulmonary  vascularity are normal. Minimal atelectasis at the left lung base. Lungs are otherwise clear. Bones are normal. IMPRESSION: Minimal atelectasis at the left lung base. This is probably due to a shallow inspiration. Electronically Signed   By: Lorriane Shire M.D.   On: 12/05/2016 09:42     ASSESSMENT AND PLAN:    41 year old male with history of seizures, tobacco dependence and TBI who presents with pleuritic chest pain.   1. Pleuritic chest pain with  pericardial effusion and nondiagnostic EKG changes: Patient has ruled out for acute coronary syndrome with negative troponins. Telemetry monitoring shows no acute arrhythmia CT scan shows no pulmonary emboli Echocardiogram shows normal ejection fraction with moderate size pleural effusion Patient's symptoms currently consistent with a viral pericarditis Continue call seen in indomethacin ESR is within normal limits Pro calcitonin level is within normal limits Appreciate cardiology consultation  2. Leukocytosis likely related to viral pericarditis We'll follow CBC for a.m.  3. Seizure disorder: Continue Depakote  4. Tobacco dependence: Patient is encouraged to quit smoking. Counseling was provided for 4 minutes.   Management plans discussed with the patient and he is in agreement.  CODE STATUS: full  TOTAL TIME TAKING CARE OF THIS PATIENT: 30 minutes.     POSSIBLE D/C tomorrow if blood cx are negative.   Rahul Malinak M.D on 12/06/2016 at 9:54 AM  Between 7am to 6pm - Pager - [phone] After 6pm go to [email or web address] - password [email or web address] [facility]  Office  [phone]  CC: Primary care physician; Patient, No Pcp Per  Note: This dictation was prepared with Dragon dictation along with smaller phrase technology. Any transcriptional errors that result from this process are unintentional.

## 2016-12-06 NOTE — Progress Notes (Signed)
[facility]  SUBJECTIVE: Patient doing better, with less chest pain, denies shortness of breath   Vitals:   12/05/16 1344 12/05/16 1934 12/06/16 0426 12/06/16 0839  BP: 122/89 116/83 121/86 (!) 136/97  Pulse: (!) 101 (!) 115 85 97  Resp: 20 18 18 18   Temp: 98.4 F (36.9 C) 98.7 F (37.1 C) 98 F (36.7 C) 98.1 F (36.7 C)  TempSrc: Oral Oral Oral Oral  SpO2: 99% 96% 98% 96%  Weight:      Height:         Intake/Output Summary (Last 24 hours) at 12/06/16 1008 Last data filed at 12/06/16 0300  Gross per 24 hour  Intake              385 ml  Output                0 ml  Net              385 ml      PHYSICAL EXAM  General: Mild to moderate distress HEENT:  Normocephalic and atramatic Neck:  No JVD.  Lungs: Clear bilaterally to auscultation and percussion. Heart: HRRR . Normal S1 and S2 without gallops or murmurs.  Abdomen: Bowel sounds are positive, abdomen soft and non-tender  Msk:  Back normal, normal gait. Normal strength and tone for age. Extremities: No clubbing, cyanosis or edema.   Neuro: Alert and oriented X 3. Psych:  Good affect, responds appropriately   LABS: Basic Metabolic Panel:  Recent Labs  93/73/42 0925 12/06/16 0143  NA 131* 135  K 4.5 4.5  CL 101 105  CO2 22 22  GLUCOSE 124* 111*  BUN 16 19  CREATININE 0.72 0.74  CALCIUM 8.8* 8.3*   Liver Function Tests:  Recent Labs  12/05/16 0925  AST 17  ALT 19  ALKPHOS 79  BILITOT 0.4  PROT 7.0  ALBUMIN 3.4*    Recent Labs  12/05/16 0924  LIPASE 21   CBC:  Recent Labs  12/05/16 0924 12/06/16 0143  WBC 20.1* 16.9*  HGB 13.3 12.5*  HCT 38.6* 36.6*  MCV 84.7 86.2  PLT 359 327   Cardiac Enzymes:  Recent Labs  12/05/16 0924 12/05/16 1559 12/06/16 0143  TROPONINI <0.03 <0.03 <0.03   BNP: Invalid input(s): POCBNP D-Dimer: No results for input(s): DDIMER in the last 72 hours. Hemoglobin A1C: No results for input(s): HGBA1C in the last 72 hours. Fasting Lipid  Panel:  Recent Labs  12/05/16 0925  CHOL 162  HDL 39*  LDLCALC 103*  TRIG 102  CHOLHDL 4.2   Thyroid Function Tests:  Recent Labs  12/05/16 1559  TSH 2.459   Anemia Panel: No results for input(s): VITAMINB12, FOLATE, FERRITIN, TIBC, IRON, RETICCTPCT in the last 72 hours.  Ct Angio Chest Pe W/cm &/or Wo Cm  Result Date: 12/05/2016 CLINICAL DATA:  Chest pain. EXAM: CT ANGIOGRAPHY CHEST CT ABDOMEN AND PELVIS WITH CONTRAST TECHNIQUE: Multidetector CT imaging of the chest was performed using the standard protocol during bolus administration of intravenous contrast. Multiplanar CT image reconstructions and MIPs were obtained to evaluate the vascular anatomy. Multidetector CT imaging of the abdomen and pelvis was performed using the standard protocol during bolus administration of intravenous contrast. CONTRAST:  100 cc Isovue 370 IV COMPARISON:  CT abdomen and pelvis 10/04/2005 FINDINGS: CTA CHEST FINDINGS Cardiovascular: No filling defects in the pulmonary arteries to suggest pulmonary emboli. Heart is normal size. Aorta is normal caliber. There is moderate pericardial effusion. Mediastinum/Nodes: Small scattered  mediastinal lymph nodes, none pathologically enlarged. No mediastinal, hilar, or axillary adenopathy. Trachea and esophagus are unremarkable. Lungs/Pleura: Small bilateral pleural effusions. Mild to moderate centrilobular emphysema. Bibasilar atelectasis. Musculoskeletal: No acute bony abnormality. Review of the MIP images confirms the above findings. CT ABDOMEN and PELVIS FINDINGS Hepatobiliary: Periportal edema noted throughout the liver with heterogeneous enhancement pattern. Small cyst in the right hepatic lobe near the gallbladder fossa. Gallbladder unremarkable. Focal fatty infiltration near the falciform ligament. Pancreas: No focal abnormality or ductal dilatation. Spleen: No focal abnormality.  Normal size. Adrenals/Urinary Tract: No adrenal abnormality. No focal renal  abnormality. No stones or hydronephrosis. Urinary bladder is unremarkable. Stomach/Bowel: Stomach, large and small bowel grossly unremarkable. Appendix is normal. Vascular/Lymphatic: No evidence of aneurysm or adenopathy. Reproductive: No visible focal abnormality. Other: No free fluid or free air. Musculoskeletal: No acute bony abnormality. Review of the MIP images confirms the above findings. IMPRESSION: No evidence of pulmonary embolus. Moderate pericardial effusion. Small bilateral pleural effusions. Bibasilar atelectasis. Periportal edema noted within the liver. This can be seen with volume overload or intrinsic liver disease such is hepatitis. Electronically Signed   By: Charlett Nose M.D.   On: 12/05/2016 11:23   Ct Abdomen Pelvis W Contrast  Result Date: 12/05/2016 CLINICAL DATA:  Chest pain. EXAM: CT ANGIOGRAPHY CHEST CT ABDOMEN AND PELVIS WITH CONTRAST TECHNIQUE: Multidetector CT imaging of the chest was performed using the standard protocol during bolus administration of intravenous contrast. Multiplanar CT image reconstructions and MIPs were obtained to evaluate the vascular anatomy. Multidetector CT imaging of the abdomen and pelvis was performed using the standard protocol during bolus administration of intravenous contrast. CONTRAST:  100 cc Isovue 370 IV COMPARISON:  CT abdomen and pelvis 10/04/2005 FINDINGS: CTA CHEST FINDINGS Cardiovascular: No filling defects in the pulmonary arteries to suggest pulmonary emboli. Heart is normal size. Aorta is normal caliber. There is moderate pericardial effusion. Mediastinum/Nodes: Small scattered mediastinal lymph nodes, none pathologically enlarged. No mediastinal, hilar, or axillary adenopathy. Trachea and esophagus are unremarkable. Lungs/Pleura: Small bilateral pleural effusions. Mild to moderate centrilobular emphysema. Bibasilar atelectasis. Musculoskeletal: No acute bony abnormality. Review of the MIP images confirms the above findings. CT ABDOMEN and  PELVIS FINDINGS Hepatobiliary: Periportal edema noted throughout the liver with heterogeneous enhancement pattern. Small cyst in the right hepatic lobe near the gallbladder fossa. Gallbladder unremarkable. Focal fatty infiltration near the falciform ligament. Pancreas: No focal abnormality or ductal dilatation. Spleen: No focal abnormality.  Normal size. Adrenals/Urinary Tract: No adrenal abnormality. No focal renal abnormality. No stones or hydronephrosis. Urinary bladder is unremarkable. Stomach/Bowel: Stomach, large and small bowel grossly unremarkable. Appendix is normal. Vascular/Lymphatic: No evidence of aneurysm or adenopathy. Reproductive: No visible focal abnormality. Other: No free fluid or free air. Musculoskeletal: No acute bony abnormality. Review of the MIP images confirms the above findings. IMPRESSION: No evidence of pulmonary embolus. Moderate pericardial effusion. Small bilateral pleural effusions. Bibasilar atelectasis. Periportal edema noted within the liver. This can be seen with volume overload or intrinsic liver disease such is hepatitis. Electronically Signed   By: Charlett Nose M.D.   On: 12/05/2016 11:23   Dg Chest Port 1 View  Result Date: 12/05/2016 CLINICAL DATA:  Chest pain. EXAM: PORTABLE CHEST 1 VIEW COMPARISON:  09/14/2015 FINDINGS: Heart size and pulmonary vascularity are normal. Minimal atelectasis at the left lung base. Lungs are otherwise clear. Bones are normal. IMPRESSION: Minimal atelectasis at the left lung base. This is probably due to a shallow inspiration. Electronically Signed   By: Fayrene Fearing  Maxwell M.D.   On: 12/05/2016 09:42     Echo normal left ventricular function, LVEF  TELEMETRY: Sinus rhythm:  ASSESSMENT AND PLAN:  Active Problems:   Chest pain    1. Pleuritic chest pain, negative troponin, likely not due to acute coronary syndrome, improving 2. Mild to moderate pericardial effusion, not circumferential, no evidence for tamponade 3.  Leukocytosis  Recommendations  1. Agree with current therapy 2. Defer pericardiocentesis, minimal effusion apically 3. Consider pericardial window if clinically indicated 4. Repeat 2-D echocardiogram in 3 months   Marcina Millard, MD, PhD, [facility] 12/06/2016 10:08 AM

## 2016-12-07 LAB — BASIC METABOLIC PANEL
Anion gap: 8 (ref 5–15)
BUN: 18 mg/dL (ref 6–20)
CALCIUM: 8.6 mg/dL — AB (ref 8.9–10.3)
CO2: 28 mmol/L (ref 22–32)
Chloride: 102 mmol/L (ref 101–111)
Creatinine, Ser: 0.69 mg/dL (ref 0.61–1.24)
GFR calc Af Amer: >60 mL/min (ref >60)
GLUCOSE: 110 mg/dL — AB (ref 65–99)
Potassium: 4.1 mmol/L (ref 3.5–5.1)
Sodium: 138 mmol/L (ref 135–145)

## 2016-12-07 LAB — CBC
HCT: 35.1 % — ABNORMAL LOW (ref 40.0–52.0)
Hemoglobin: 12.1 g/dL — ABNORMAL LOW (ref 13.0–18.0)
MCH: 29.8 pg (ref 26.0–34.0)
MCHC: 34.4 g/dL (ref 32.0–36.0)
MCV: 86.5 fL (ref 80.0–100.0)
PLATELETS: 325 10*3/uL (ref 150–440)
RBC: 4.06 MIL/uL — ABNORMAL LOW (ref 4.40–5.90)
RDW: 14.7 % — AB (ref 11.5–14.5)
WBC: 14.4 10*3/uL — ABNORMAL HIGH (ref 3.8–10.6)

## 2016-12-07 LAB — PROCALCITONIN: Procalcitonin: 0.36 ng/mL

## 2016-12-07 MED ORDER — INDOMETHACIN 50 MG PO CAPS: 50.0000 mg | ORAL_CAPSULE | Freq: Three times a day (TID) | ORAL | 0 refills | Status: AC

## 2016-12-07 MED ORDER — INDOMETHACIN 50 MG PO CAPS
50.0000 mg | ORAL_CAPSULE | Freq: Three times a day (TID) | ORAL | 0 refills | Status: DC
Start: 2016-12-07 — End: 2016-12-07

## 2016-12-07 MED ORDER — NICOTINE 21 MG/24HR TD PT24: 21.0000 mg | MEDICATED_PATCH | Freq: Every day | TRANSDERMAL | 0 refills | Status: AC

## 2016-12-07 MED ORDER — RANITIDINE HCL 150 MG PO TABS: 150.0000 mg | ORAL_TABLET | Freq: Two times a day (BID) | ORAL | 1 refills | Status: DC

## 2016-12-07 MED ORDER — COLCHICINE 0.6 MG PO TABS: 0.6000 mg | ORAL_TABLET | Freq: Two times a day (BID) | ORAL | 0 refills | Status: DC

## 2016-12-07 MED ORDER — NICOTINE 21 MG/24HR TD PT24: 21.0000 mg | MEDICATED_PATCH | Freq: Every day | TRANSDERMAL | 0 refills | Status: DC

## 2016-12-07 NOTE — Discharge Summary (Addendum)
[facility] NAME: Justin Donaldson    MR#:  [id number]  DATE OF BIRTH:  April 11, 1976  DATE OF ADMISSION:  12/05/2016 ADMITTING PHYSICIAN: Dustin Flock, MD  DATE OF DISCHARGE: 12/07/2016 PRIMARY CARE PHYSICIAN: Dr Kizzie Fantasia Colford    ADMISSION DIAGNOSIS:  Pericardial effusion [I31.3] Chest pain, unspecified type [R07.9]  DISCHARGE DIAGNOSIS:  Active Problems:   Chest pain   SECONDARY DIAGNOSIS:   Past Medical History:  Diagnosis Date  . Seizures ([place])   . Traumatic brain injury [facility])     HOSPITAL COURSE:  \ 41 year old male with history of seizures, tobacco dependence and TBI who presents with pleuritic chest pain.   1. Pleuritic chest pain with pericardial effusion and nondiagnostic EKG changes: Patient has ruled out for acute coronary syndrome with negative troponins. Telemetry monitoring shows no acute arrhythmia CT scan shows no pulmonary emboli Echocardiogram shows normal ejection fraction with moderate size pleural effusion Patient's symptoms currently consistent with a viral pericarditis Continue COLCHINE and indomethacin ESR is within normal limits Procalcitonin level is within normal limits He will need outpatient follow up ECHO in 3 months.   2. Leukocytosis related to viral pericarditis CBC improved/  3. Seizure disorder: Continue Depakote  4. Tobacco dependence: Patient is encouraged to quit smoking. Counseling was provided for 4 minutes.    DISCHARGE CONDITIONS AND DIET:   Stable Regular diet  CONSULTS OBTAINED:  Treatment Team:  Isaias Cowman, MD  DRUG ALLERGIES:  No Known Allergies  DISCHARGE MEDICATIONS:   Current Discharge Medication List    START taking these medications   Details  colchicine 0.6 MG tablet Take 1 tablet (0.6 mg total) by mouth 2 (two) times daily. Qty: 60 tablet, Refills: 0    indomethacin (INDOCIN) 50 MG capsule Take 1 capsule (50 mg total) by  mouth 3 (three) times daily. Qty: 42 capsule, Refills: 0    nicotine (NICODERM CQ - DOSED IN MG/24 HOURS) 21 mg/24hr patch Place 1 patch (21 mg total) onto the skin daily. Qty: 28 patch, Refills: 0    ranitidine (ZANTAC) 150 MG tablet Take 1 tablet (150 mg total) by mouth 2 (two) times daily. Qty: 60 tablet, Refills: 1      CONTINUE these medications which have NOT CHANGED   Details  divalproex (DEPAKOTE) 500 MG DR tablet Take 500 mg by mouth 2 (two) times daily.    hydrOXYzine (ATARAX/VISTARIL) 50 MG tablet Take 50 mg by mouth every 6 (six) hours as needed.    sertraline (ZOLOFT) 100 MG tablet Take 200 mg by mouth daily.      STOP taking these medications     ketorolac (ACULAR) 0.5 % ophthalmic solution      propranolol (INDERAL) 10 MG tablet      trimethoprim-polymyxin b (POLYTRIM) ophthalmic solution           Today   CHIEF COMPLAINT:  Doing better this am less chest pain   VITAL SIGNS:  Blood pressure 117/84, pulse 90, temperature 97.7 F (36.5 C), temperature source Oral, resp. rate 18, height 5' 10"  (1.778 m), weight 68.9 kg (152 lb), SpO2 95 %.   REVIEW OF SYSTEMS:  Review of Systems  Constitutional: Negative.  Negative for chills, fever and malaise/fatigue.  HENT: Negative.  Negative for ear discharge, ear pain, hearing loss, nosebleeds and sore throat.   Eyes: Negative.  Negative for blurred vision and pain.  Respiratory: Negative.  Negative for cough, hemoptysis, shortness of breath and wheezing.  Cardiovascular: Positive for chest pain (better). Negative for palpitations and leg swelling.  Gastrointestinal: Negative.  Negative for abdominal pain, blood in stool, diarrhea, nausea and vomiting.  Genitourinary: Negative.  Negative for dysuria.  Musculoskeletal: Negative.  Negative for back pain.  Skin: Negative.   Neurological: Negative for dizziness, tremors, speech change, focal weakness, seizures and headaches.  Endo/Heme/Allergies: Negative.  Does  not bruise/bleed easily.  Psychiatric/Behavioral: Negative.  Negative for depression, hallucinations and suicidal ideas.     PHYSICAL EXAMINATION:  GENERAL:  41 y.o.-year-old patient lying in the bed with no acute distress.  NECK:  Supple, no jugular venous distention. No thyroid enlargement, no tenderness.  LUNGS: Normal breath sounds bilaterally, no wheezing, rales,rhonchi  No use of accessory muscles of respiration.  CARDIOVASCULAR: S1, S2 normal. No murmurs, rubs, or gallops.  ABDOMEN: Soft, non-tender, non-distended. Bowel sounds present. No organomegaly or mass.  EXTREMITIES: No pedal edema, cyanosis, or clubbing.  PSYCHIATRIC: The patient is alert and oriented x 3.  SKIN: No obvious rash, lesion, or ulcer.   DATA REVIEW:   CBC  Recent Labs Lab 12/07/16 0436  WBC 14.4*  HGB 12.1*  HCT 35.1*  PLT 325    Chemistries   Recent Labs Lab 12/05/16 0925  12/07/16 0436  NA 131*  < > 138  K 4.5  < > 4.1  CL 101  < > 102  CO2 22  < > 28  GLUCOSE 124*  < > 110*  BUN 16  < > 18  CREATININE 0.72  < > 0.69  CALCIUM 8.8*  < > 8.6*  AST 17  --   --   ALT 19  --   --   ALKPHOS 79  --   --   BILITOT 0.4  --   --   < > = values in this interval not displayed.  Cardiac Enzymes  Recent Labs Lab 12/05/16 0924 12/05/16 1559 12/06/16 0143  TROPONINI <0.03 <0.03 <0.03    Microbiology Results  @[email or web address] @  RADIOLOGY:  Ct Angio Chest Pe W/cm &/or Wo Cm  Result Date: 12/05/2016 CLINICAL DATA:  Chest pain. EXAM: CT ANGIOGRAPHY CHEST CT ABDOMEN AND PELVIS WITH CONTRAST TECHNIQUE: Multidetector CT imaging of the chest was performed using the standard protocol during bolus administration of intravenous contrast. Multiplanar CT image reconstructions and MIPs were obtained to evaluate the vascular anatomy. Multidetector CT imaging of the abdomen and pelvis was performed using the standard protocol during bolus administration of intravenous contrast. CONTRAST:  100 cc Isovue 370  IV COMPARISON:  CT abdomen and pelvis 10/04/2005 FINDINGS: CTA CHEST FINDINGS Cardiovascular: No filling defects in the pulmonary arteries to suggest pulmonary emboli. Heart is normal size. Aorta is normal caliber. There is moderate pericardial effusion. Mediastinum/Nodes: Small scattered mediastinal lymph nodes, none pathologically enlarged. No mediastinal, hilar, or axillary adenopathy. Trachea and esophagus are unremarkable. Lungs/Pleura: Small bilateral pleural effusions. Mild to moderate centrilobular emphysema. Bibasilar atelectasis. Musculoskeletal: No acute bony abnormality. Review of the MIP images confirms the above findings. CT ABDOMEN and PELVIS FINDINGS Hepatobiliary: Periportal edema noted throughout the liver with heterogeneous enhancement pattern. Small cyst in the right hepatic lobe near the gallbladder fossa. Gallbladder unremarkable. Focal fatty infiltration near the falciform ligament. Pancreas: No focal abnormality or ductal dilatation. Spleen: No focal abnormality.  Normal size. Adrenals/Urinary Tract: No adrenal abnormality. No focal renal abnormality. No stones or hydronephrosis. Urinary bladder is unremarkable. Stomach/Bowel: Stomach, large and small bowel grossly unremarkable. Appendix is normal. Vascular/Lymphatic: No evidence of aneurysm or adenopathy.  Reproductive: No visible focal abnormality. Other: No free fluid or free air. Musculoskeletal: No acute bony abnormality. Review of the MIP images confirms the above findings. IMPRESSION: No evidence of pulmonary embolus. Moderate pericardial effusion. Small bilateral pleural effusions. Bibasilar atelectasis. Periportal edema noted within the liver. This can be seen with volume overload or intrinsic liver disease such is hepatitis. Electronically Signed   By: Rolm Baptise M.D.   On: 12/05/2016 11:23   Ct Abdomen Pelvis W Contrast  Result Date: 12/05/2016 CLINICAL DATA:  Chest pain. EXAM: CT ANGIOGRAPHY CHEST CT ABDOMEN AND PELVIS WITH  CONTRAST TECHNIQUE: Multidetector CT imaging of the chest was performed using the standard protocol during bolus administration of intravenous contrast. Multiplanar CT image reconstructions and MIPs were obtained to evaluate the vascular anatomy. Multidetector CT imaging of the abdomen and pelvis was performed using the standard protocol during bolus administration of intravenous contrast. CONTRAST:  100 cc Isovue 370 IV COMPARISON:  CT abdomen and pelvis 10/04/2005 FINDINGS: CTA CHEST FINDINGS Cardiovascular: No filling defects in the pulmonary arteries to suggest pulmonary emboli. Heart is normal size. Aorta is normal caliber. There is moderate pericardial effusion. Mediastinum/Nodes: Small scattered mediastinal lymph nodes, none pathologically enlarged. No mediastinal, hilar, or axillary adenopathy. Trachea and esophagus are unremarkable. Lungs/Pleura: Small bilateral pleural effusions. Mild to moderate centrilobular emphysema. Bibasilar atelectasis. Musculoskeletal: No acute bony abnormality. Review of the MIP images confirms the above findings. CT ABDOMEN and PELVIS FINDINGS Hepatobiliary: Periportal edema noted throughout the liver with heterogeneous enhancement pattern. Small cyst in the right hepatic lobe near the gallbladder fossa. Gallbladder unremarkable. Focal fatty infiltration near the falciform ligament. Pancreas: No focal abnormality or ductal dilatation. Spleen: No focal abnormality.  Normal size. Adrenals/Urinary Tract: No adrenal abnormality. No focal renal abnormality. No stones or hydronephrosis. Urinary bladder is unremarkable. Stomach/Bowel: Stomach, large and small bowel grossly unremarkable. Appendix is normal. Vascular/Lymphatic: No evidence of aneurysm or adenopathy. Reproductive: No visible focal abnormality. Other: No free fluid or free air. Musculoskeletal: No acute bony abnormality. Review of the MIP images confirms the above findings. IMPRESSION: No evidence of pulmonary embolus.  Moderate pericardial effusion. Small bilateral pleural effusions. Bibasilar atelectasis. Periportal edema noted within the liver. This can be seen with volume overload or intrinsic liver disease such is hepatitis. Electronically Signed   By: Rolm Baptise M.D.   On: 12/05/2016 11:23   Dg Chest Port 1 View  Result Date: 12/05/2016 CLINICAL DATA:  Chest pain. EXAM: PORTABLE CHEST 1 VIEW COMPARISON:  09/14/2015 FINDINGS: Heart size and pulmonary vascularity are normal. Minimal atelectasis at the left lung base. Lungs are otherwise clear. Bones are normal. IMPRESSION: Minimal atelectasis at the left lung base. This is probably due to a shallow inspiration. Electronically Signed   By: Lorriane Shire M.D.   On: 12/05/2016 09:42      Current Discharge Medication List    START taking these medications   Details  colchicine 0.6 MG tablet Take 1 tablet (0.6 mg total) by mouth 2 (two) times daily. Qty: 60 tablet, Refills: 0    indomethacin (INDOCIN) 50 MG capsule Take 1 capsule (50 mg total) by mouth 3 (three) times daily. Qty: 42 capsule, Refills: 0    nicotine (NICODERM CQ - DOSED IN MG/24 HOURS) 21 mg/24hr patch Place 1 patch (21 mg total) onto the skin daily. Qty: 28 patch, Refills: 0    ranitidine (ZANTAC) 150 MG tablet Take 1 tablet (150 mg total) by mouth 2 (two) times daily. Qty: 60 tablet, Refills: 1  CONTINUE these medications which have NOT CHANGED   Details  divalproex (DEPAKOTE) 500 MG DR tablet Take 500 mg by mouth 2 (two) times daily.    hydrOXYzine (ATARAX/VISTARIL) 50 MG tablet Take 50 mg by mouth every 6 (six) hours as needed.    sertraline (ZOLOFT) 100 MG tablet Take 200 mg by mouth daily.      STOP taking these medications     ketorolac (ACULAR) 0.5 % ophthalmic solution      propranolol (INDERAL) 10 MG tablet      trimethoprim-polymyxin b (POLYTRIM) ophthalmic solution           Management plans discussed with the patient and he is in agreement. Stable for  discharge   Patient should follow up with pcp  CODE STATUS:     Code Status Orders        Start     Ordered   12/05/16 1358  Full code  Continuous     12/05/16 1357    Code Status History    Date Active Date Inactive Code Status Order ID Comments User Context   This patient has a current code status but no historical code status.      TOTAL TIME TAKING CARE OF THIS PATIENT: 38 minutes.    Note: This dictation was prepared with Dragon dictation along with smaller phrase technology. Any transcriptional errors that result from this process are unintentional.  Houa Ackert M.D on 12/07/2016 at 9:17 AM  Between 7am to 6pm - Pager - [phone] After 6pm go to [email or web address] - Proofreader  [facility]  Office  [phone]  CC: Primary care physician; Dr Ricki Rodriguez

## 2016-12-07 NOTE — Progress Notes (Signed)
Pt instructed on discharge instructions and medications and verbalized understanding, prescriptions given to pt, pt left ambulating in stable condition with no s/s of distress or discomfort. Suzzette Righter

## 2016-12-07 NOTE — Progress Notes (Signed)
[facility] Cardiology  SUBJECTIVE: The patient reports his chest pain is better   Vitals:   12/06/16 0839 12/06/16 1325 12/06/16 1923 12/07/16 0452  BP: (!) 136/97 106/72 125/88 117/84  Pulse: 97 97 100 90  Resp: 18 14 18 18   Temp: 98.1 F (36.7 C) 97.8 F (36.6 C) 98.3 F (36.8 C) 97.7 F (36.5 C)  TempSrc: Oral Oral Oral Oral  SpO2: 96% 97% 96% 95%  Weight:      Height:         Intake/Output Summary (Last 24 hours) at 12/07/16 0913 Last data filed at 12/07/16 0200  Gross per 24 hour  Intake              240 ml  Output                0 ml  Net              240 ml      PHYSICAL EXAM  General: Well developed, well nourished, in no acute distress HEENT:  Normocephalic and atramatic Neck:  No JVD.  Lungs: Clear bilaterally to auscultation and percussion. Heart: HRRR . Normal S1 and S2 without gallops or murmurs.  Abdomen: Bowel sounds are positive, abdomen soft and non-tender  Msk:  Back normal, normal gait. Normal strength and tone for age. Extremities: No clubbing, cyanosis or edema.   Neuro: Alert and oriented X 3. Psych:  Good affect, responds appropriately   LABS: Basic Metabolic Panel:  Recent Labs  16/10/96 0143 12/07/16 0436  NA 135 138  K 4.5 4.1  CL 105 102  CO2 22 28  GLUCOSE 111* 110*  BUN 19 18  CREATININE 0.74 0.69  CALCIUM 8.3* 8.6*   Liver Function Tests:  Recent Labs  12/05/16 0925  AST 17  ALT 19  ALKPHOS 79  BILITOT 0.4  PROT 7.0  ALBUMIN 3.4*    Recent Labs  12/05/16 0924  LIPASE 21   CBC:  Recent Labs  12/06/16 0143 12/07/16 0436  WBC 16.9* 14.4*  HGB 12.5* 12.1*  HCT 36.6* 35.1*  MCV 86.2 86.5  PLT 327 325   Cardiac Enzymes:  Recent Labs  12/05/16 0924 12/05/16 1559 12/06/16 0143  TROPONINI <0.03 <0.03 <0.03   BNP: Invalid input(s): POCBNP D-Dimer: No results for input(s): DDIMER in the last 72 hours. Hemoglobin A1C: No results for input(s): HGBA1C in the last 72 hours. Fasting Lipid Panel:  Recent  Labs  12/05/16 0925  CHOL 162  HDL 39*  LDLCALC 103*  TRIG 102  CHOLHDL 4.2   Thyroid Function Tests:  Recent Labs  12/05/16 1559  TSH 2.459   Anemia Panel: No results for input(s): VITAMINB12, FOLATE, FERRITIN, TIBC, IRON, RETICCTPCT in the last 72 hours.  Ct Angio Chest Pe W/cm &/or Wo Cm  Result Date: 12/05/2016 CLINICAL DATA:  Chest pain. EXAM: CT ANGIOGRAPHY CHEST CT ABDOMEN AND PELVIS WITH CONTRAST TECHNIQUE: Multidetector CT imaging of the chest was performed using the standard protocol during bolus administration of intravenous contrast. Multiplanar CT image reconstructions and MIPs were obtained to evaluate the vascular anatomy. Multidetector CT imaging of the abdomen and pelvis was performed using the standard protocol during bolus administration of intravenous contrast. CONTRAST:  100 cc Isovue 370 IV COMPARISON:  CT abdomen and pelvis 10/04/2005 FINDINGS: CTA CHEST FINDINGS Cardiovascular: No filling defects in the pulmonary arteries to suggest pulmonary emboli. Heart is normal size. Aorta is normal caliber. There is moderate pericardial effusion. Mediastinum/Nodes: Small scattered mediastinal  lymph nodes, none pathologically enlarged. No mediastinal, hilar, or axillary adenopathy. Trachea and esophagus are unremarkable. Lungs/Pleura: Small bilateral pleural effusions. Mild to moderate centrilobular emphysema. Bibasilar atelectasis. Musculoskeletal: No acute bony abnormality. Review of the MIP images confirms the above findings. CT ABDOMEN and PELVIS FINDINGS Hepatobiliary: Periportal edema noted throughout the liver with heterogeneous enhancement pattern. Small cyst in the right hepatic lobe near the gallbladder fossa. Gallbladder unremarkable. Focal fatty infiltration near the falciform ligament. Pancreas: No focal abnormality or ductal dilatation. Spleen: No focal abnormality.  Normal size. Adrenals/Urinary Tract: No adrenal abnormality. No focal renal abnormality. No stones or  hydronephrosis. Urinary bladder is unremarkable. Stomach/Bowel: Stomach, large and small bowel grossly unremarkable. Appendix is normal. Vascular/Lymphatic: No evidence of aneurysm or adenopathy. Reproductive: No visible focal abnormality. Other: No free fluid or free air. Musculoskeletal: No acute bony abnormality. Review of the MIP images confirms the above findings. IMPRESSION: No evidence of pulmonary embolus. Moderate pericardial effusion. Small bilateral pleural effusions. Bibasilar atelectasis. Periportal edema noted within the liver. This can be seen with volume overload or intrinsic liver disease such is hepatitis. Electronically Signed   By: Charlett Nose M.D.   On: 12/05/2016 11:23   Ct Abdomen Pelvis W Contrast  Result Date: 12/05/2016 CLINICAL DATA:  Chest pain. EXAM: CT ANGIOGRAPHY CHEST CT ABDOMEN AND PELVIS WITH CONTRAST TECHNIQUE: Multidetector CT imaging of the chest was performed using the standard protocol during bolus administration of intravenous contrast. Multiplanar CT image reconstructions and MIPs were obtained to evaluate the vascular anatomy. Multidetector CT imaging of the abdomen and pelvis was performed using the standard protocol during bolus administration of intravenous contrast. CONTRAST:  100 cc Isovue 370 IV COMPARISON:  CT abdomen and pelvis 10/04/2005 FINDINGS: CTA CHEST FINDINGS Cardiovascular: No filling defects in the pulmonary arteries to suggest pulmonary emboli. Heart is normal size. Aorta is normal caliber. There is moderate pericardial effusion. Mediastinum/Nodes: Small scattered mediastinal lymph nodes, none pathologically enlarged. No mediastinal, hilar, or axillary adenopathy. Trachea and esophagus are unremarkable. Lungs/Pleura: Small bilateral pleural effusions. Mild to moderate centrilobular emphysema. Bibasilar atelectasis. Musculoskeletal: No acute bony abnormality. Review of the MIP images confirms the above findings. CT ABDOMEN and PELVIS FINDINGS  Hepatobiliary: Periportal edema noted throughout the liver with heterogeneous enhancement pattern. Small cyst in the right hepatic lobe near the gallbladder fossa. Gallbladder unremarkable. Focal fatty infiltration near the falciform ligament. Pancreas: No focal abnormality or ductal dilatation. Spleen: No focal abnormality.  Normal size. Adrenals/Urinary Tract: No adrenal abnormality. No focal renal abnormality. No stones or hydronephrosis. Urinary bladder is unremarkable. Stomach/Bowel: Stomach, large and small bowel grossly unremarkable. Appendix is normal. Vascular/Lymphatic: No evidence of aneurysm or adenopathy. Reproductive: No visible focal abnormality. Other: No free fluid or free air. Musculoskeletal: No acute bony abnormality. Review of the MIP images confirms the above findings. IMPRESSION: No evidence of pulmonary embolus. Moderate pericardial effusion. Small bilateral pleural effusions. Bibasilar atelectasis. Periportal edema noted within the liver. This can be seen with volume overload or intrinsic liver disease such is hepatitis. Electronically Signed   By: Charlett Nose M.D.   On: 12/05/2016 11:23   Dg Chest Port 1 View  Result Date: 12/05/2016 CLINICAL DATA:  Chest pain. EXAM: PORTABLE CHEST 1 VIEW COMPARISON:  09/14/2015 FINDINGS: Heart size and pulmonary vascularity are normal. Minimal atelectasis at the left lung base. Lungs are otherwise clear. Bones are normal. IMPRESSION: Minimal atelectasis at the left lung base. This is probably due to a shallow inspiration. Electronically Signed   By: Fayrene Fearing  Maxwell M.D.   On: 12/05/2016 09:42     Echo normal left ventricular function, LVEF 50-55%, small to moderate pericardial effusion, without evidence for pericardial tamponade  TELEMETRY: Normal sinus rhythm:  ASSESSMENT AND PLAN:  Active Problems:   Chest pain    1. Pericarditis, chest pain improved 2. Small to moderate pericardial effusion, not circumferential, no evidence for  tamponade, not amenable to pericardiocentesis  Recommendations  1. Agree with current therapy 2. Defer pericardiocentesis 3. Continue indomethacin for 2 weeks 4. Continue colchicine 5. Repeat 2-D echocardiogram in 3 months  Signed off for now, please call if any questions   Marcina Millard, MD, PhD, [facility] 12/07/2016 9:13 AM

## 2016-12-10 LAB — CULTURE, BLOOD (ROUTINE X 2)
Culture: NO GROWTH
Culture: NO GROWTH
SPECIAL REQUESTS: ADEQUATE

## 2017-04-04 ENCOUNTER — Other Ambulatory Visit: Payer: Self-pay

## 2017-04-04 ENCOUNTER — Emergency Department
Admission: EM | Admit: 2017-04-04 | Discharge: 2017-04-04 | Disposition: A | Discharge status: Home / Self Care | Payer: Self-pay | Attending: Emergency Medicine | Admitting: Emergency Medicine

## 2017-04-04 ENCOUNTER — Encounter: Payer: Self-pay | Admitting: Emergency Medicine

## 2017-04-04 ENCOUNTER — Emergency Department: Payer: Self-pay

## 2017-04-04 DIAGNOSIS — R079 Chest pain, unspecified: Secondary | ICD-10-CM | POA: Insufficient documentation

## 2017-04-04 DIAGNOSIS — R5381 Other malaise: Secondary | ICD-10-CM | POA: Insufficient documentation

## 2017-04-04 DIAGNOSIS — F17210 Nicotine dependence, cigarettes, uncomplicated: Secondary | ICD-10-CM | POA: Insufficient documentation

## 2017-04-04 DIAGNOSIS — Z79899 Other long term (current) drug therapy: Secondary | ICD-10-CM | POA: Insufficient documentation

## 2017-04-04 LAB — CBC WITH DIFFERENTIAL/PLATELET
Basophils Absolute: 0.1 10*3/uL (ref 0–0.1)
Basophils Relative: 1 %
Eosinophils Absolute: 0.4 10*3/uL (ref 0–0.7)
Eosinophils Relative: 4 %
HEMATOCRIT: 42.7 % (ref 40.0–52.0)
HEMOGLOBIN: 14.8 g/dL (ref 13.0–18.0)
LYMPHS ABS: 2.1 10*3/uL (ref 1.0–3.6)
Lymphocytes Relative: 24 %
MCH: 29.4 pg (ref 26.0–34.0)
MCHC: 34.6 g/dL (ref 32.0–36.0)
MCV: 85 fL (ref 80.0–100.0)
MONOS PCT: 10 %
Monocytes Absolute: 0.8 10*3/uL (ref 0.2–1.0)
NEUTROS ABS: 5.2 10*3/uL (ref 1.4–6.5)
NEUTROS PCT: 61 %
Platelets: 334 10*3/uL (ref 150–440)
RBC: 5.03 MIL/uL (ref 4.40–5.90)
RDW: 16.5 % — ABNORMAL HIGH (ref 11.5–14.5)
WBC: 8.6 10*3/uL (ref 3.8–10.6)

## 2017-04-04 LAB — HEPATIC FUNCTION PANEL
ALBUMIN: 3.9 g/dL (ref 3.5–5.0)
ALK PHOS: 79 U/L (ref 38–126)
ALT: 18 U/L (ref 17–63)
AST: 22 U/L (ref 15–41)
Bilirubin, Direct: <0.1 mg/dL — ABNORMAL LOW (ref 0.1–0.5)
TOTAL PROTEIN: 7.1 g/dL (ref 6.5–8.1)
Total Bilirubin: 0.3 mg/dL (ref 0.3–1.2)

## 2017-04-04 LAB — BASIC METABOLIC PANEL
Anion gap: 11 (ref 5–15)
BUN: 25 mg/dL — AB (ref 6–20)
CHLORIDE: 102 mmol/L (ref 101–111)
CO2: 21 mmol/L — ABNORMAL LOW (ref 22–32)
Calcium: 9.6 mg/dL (ref 8.9–10.3)
Creatinine, Ser: 0.88 mg/dL (ref 0.61–1.24)
GFR calc Af Amer: >60 mL/min (ref >60)
GFR calc non Af Amer: >60 mL/min (ref >60)
Glucose, Bld: 95 mg/dL (ref 65–99)
POTASSIUM: 3.8 mmol/L (ref 3.5–5.1)
SODIUM: 134 mmol/L — AB (ref 135–145)

## 2017-04-04 LAB — TROPONIN I: Troponin I: <0.03 ng/mL (ref <0.03)

## 2017-04-04 LAB — LIPASE, BLOOD: Lipase: 21 U/L (ref 11–51)

## 2017-04-04 LAB — VALPROIC ACID LEVEL: Valproic Acid Lvl: <10 ug/mL — ABNORMAL LOW (ref 50.0–100.0)

## 2017-04-04 MED ORDER — SODIUM CHLORIDE 0.9 % IV BOLUS (SEPSIS)
1000.0000 mL | Freq: Once | INTRAVENOUS | Status: AC
  Administered 2017-04-04: 1000 mL via INTRAVENOUS

## 2017-04-04 MED ORDER — KETOROLAC TROMETHAMINE 30 MG/ML IJ SOLN
15.0000 mg | Freq: Once | INTRAMUSCULAR | Status: AC
  Administered 2017-04-04: 15 mg via INTRAVENOUS
  Filled 2017-04-04: qty 1

## 2017-04-04 NOTE — ED Triage Notes (Signed)
Patient coming from home via [facility] for sudden onset of Left sided chest pain, right sided abd pain, left leg, back of neck, feeling faint and having temors. Patient was breathing 30-40 bpm per EMS but all other vitals WNL.

## 2017-04-04 NOTE — ED Notes (Signed)
Pt. States at around midnight tonight he woke with left-sided chest pain and left sided numbness.

## 2017-04-04 NOTE — ED Provider Notes (Signed)
[facility] Emergency Department Provider Note  ____________________________________________   First MD Initiated Contact with Patient 04/04/17 [phone]     (approximate)  I have reviewed the triage vital signs and the nursing notes.   HISTORY  Chief Complaint Chest Pain  Level 5 exemption history limited as the patient is an extraordinarily poor historian  HPI Justin Donaldson is a 41 y.o. male who comes to the emergency department via EMS with multiple complaints.  He says that at some point today while he was at home he rolled over and felt "dizzy and like things were not right".  Symptoms had a gradual onset and have been constant ever since.  He does report some vague upper chest pain along with upper abdominal discomfort.  He has some nausea but no vomiting.  The patient has a past medical history of viral pericarditis 4 months ago and had a pericardial effusion at that time.  He has missed his follow-up echocardiogram.  12/05/16 Echo: Study Conclusions  - Left ventricle: The cavity size was normal. There was mild   concentric hypertrophy. Systolic function was normal. The   estimated ejection fraction was in the range of 50% to 55%. - Aortic valve: Valve area (Vmax): 2.44 cm^2. - Pericardium, extracardiac: A moderate, free-flowing pericardial   effusion was identified along the left ventricular free wall.   Features were not consistent with tamponade physiology.   Past Medical History:  Diagnosis Date  . Seizures (HCC)   . Traumatic brain injury [facility](HCC)     Patient Active Problem List   Diagnosis Date Noted  . Chest pain 12/05/2016    History reviewed. No pertinent surgical history.  Prior to Admission medications   Medication Sig Start Date End Date Taking? Authorizing Provider  divalproex (DEPAKOTE) 500 MG DR tablet Take 500 mg by mouth 2 (two) times daily.   Yes [provider]  hydrOXYzine (ATARAX/VISTARIL) 50 MG tablet Take 50  mg by mouth every 6 (six) hours as needed. 08/06/16  Yes [provider]  propranolol (INDERAL) 10 MG tablet Take 10 mg by mouth 3 (three) times daily.   Yes [provider]  sertraline (ZOLOFT) 100 MG tablet Take 200 mg by mouth daily. 10/23/16 10/23/17 Yes [provider]  colchicine 0.6 MG tablet Take 1 tablet (0.6 mg total) by mouth 2 (two) times daily. 12/07/16 02/06/17  Adrian SaranMody, Sital, MD  nicotine (NICODERM CQ - DOSED IN MG/24 HOURS) 21 mg/24hr patch Place 1 patch (21 mg total) onto the skin daily. Patient not taking: Reported on 04/04/2017 12/07/16   Adrian SaranMody, Sital, MD  ranitidine (ZANTAC) 150 MG tablet Take 1 tablet (150 mg total) by mouth 2 (two) times daily. Patient not taking: Reported on 04/04/2017 12/07/16 12/07/17  Adrian SaranMody, Sital, MD    Allergies Patient has no known allergies.  Family History  Problem Relation Age of Onset  . CAD Father     Social History Social History   Tobacco Use  . Smoking status: Current Every Day Smoker    Packs/day: 1.00    Types: Cigarettes  . Smokeless tobacco: Never Used  Substance Use Topics  . Alcohol use: Yes    Comment: occ  . Drug use: No    Comment: pt states he quit smoking marijuana    Review of Systems Constitutional: No fever/chills Eyes: No visual changes. ENT: No sore throat. Cardiovascular: Positive for chest pain. Respiratory: Positive for shortness of breath. Gastrointestinal: No abdominal pain.  Positive for nausea, no  vomiting.  No diarrhea.  No constipation. Genitourinary: Negative for dysuria. Musculoskeletal: Negative for back pain. Skin: Negative for rash. Neurological: Negative for headaches, focal weakness or numbness.   ____________________________________________   PHYSICAL EXAM:  VITAL SIGNS: ED Triage Vitals  Enc Vitals Group     BP      Pulse      Resp      Temp      Temp src      SpO2      Weight      Height      Head Circumference      Peak Flow      Pain Score      Pain  Loc      Pain Edu?      Excl. in GC?     Constitutional: Alert and oriented x4 closing his eyes strange affect no respiratory distress although does have increased rate Eyes: PERRL EOMI. Head: Atraumatic. Nose: No congestion/rhinnorhea. Mouth/Throat: No trismus Neck: No stridor.   Cardiovascular: Normal rate, regular rhythm. Grossly normal heart sounds.  Good peripheral circulation. Respiratory: Increased respiratory effort.  No retractions. Lungs CTAB and moving good air Gastrointestinal: Soft nontender Musculoskeletal: No lower extremity edema   Neurologic:  Normal speech and language. No gross focal neurologic deficits are appreciated. Skin:  Skin is warm, dry and intact. No rash noted. Psychiatric: Strange affect appears to be hyperventilating and anxious    ____________________________________________   DIFFERENTIAL includes but not limited to  Acute coronary syndrome, pneumothorax, metabolic derangement, drug overdose ____________________________________________   LABS (all labs ordered are listed, but only abnormal results are displayed)  Labs Reviewed  BASIC METABOLIC PANEL - Abnormal; Notable for the following components:      Result Value   Sodium 134 (*)    CO2 21 (*)    BUN 25 (*)    All other components within normal limits  HEPATIC FUNCTION PANEL - Abnormal; Notable for the following components:   Bilirubin, Direct <0.1 (*)    All other components within normal limits  CBC WITH DIFFERENTIAL/PLATELET - Abnormal; Notable for the following components:   RDW 16.5 (*)    All other components within normal limits  VALPROIC ACID LEVEL - Abnormal; Notable for the following components:   Valproic Acid Lvl <10 (*)    All other components within normal limits  LIPASE, BLOOD  TROPONIN I  URINALYSIS, COMPLETE (UACMP) WITH MICROSCOPIC    Lab work reviewed by me with no acute disease but notably his Depakote level  is __________________________________________  EKG  ED ECG REPORT I, Merrily Brittle, the attending physician, personally viewed and interpreted this ECG.  Date: 04/04/2017 EKG Time:  Rate: 86 Rhythm: normal sinus rhythm QRS Axis: normal Intervals: normal ST/T Wave abnormalities: normal Narrative Interpretation: no evidence of acute ischemia  ____________________________________________  RADIOLOGY  X-ray reviewed by me with no acute disease ____________________________________________   PROCEDURES  Procedure(s) performed: no  Procedures  Critical Care performed: no  Observation: no ____________________________________________   INITIAL IMPRESSION / ASSESSMENT AND PLAN / ED COURSE  Pertinent labs & imaging results that were available during my care of the patient were reviewed by me and considered in my medical decision making (see chart for details).       On arrival my bedside ultrasound confirms no pericardial effusion. ____________________________________________  ----------------------------------------- 3:58 AM on 04/04/2017 -----------------------------------------  Patient's family is now at bedside able to confirm that he has to his baseline.  They said that he  has "episodes" like this not infrequently.  Patient feels better he has no abnormal incision this coming week for reevaluation.  Strict return positive given and patient verbalized understanding   FINAL CLINICAL IMPRESSION(S) / ED DIAGNOSES  Final diagnoses:  Malaise  Nonspecific chest pain      NEW MEDICATIONS STARTED DURING THIS VISIT:  This SmartLink is deprecated. Use AVSMEDLIST instead to display the medication list for a patient.   Note:  This document was prepared using Dragon voice recognition software and may include unintentional dictation errors.     Merrily Brittleifenbark, Jonica Bickhart, MD 04/04/17 [phone]

## 2017-04-04 NOTE — Discharge Instructions (Signed)
Fortunately today your blood work, your EKG, and your chest x-ray were all reassuring.  Please make an appointment to follow-up with your [facility] within 1 week for recheck.  Return to the emergency department sooner for any concerns.  It was a pleasure to take care of you today, and thank you for coming to our emergency department.  If you have any questions or concerns before leaving please ask the nurse to grab me and I'm more than happy to go through your aftercare instructions again.  If you were prescribed any opioid pain medication today such as Norco, Vicodin, Percocet, morphine, hydrocodone, or oxycodone please make sure you do not drive when you are taking this medication as it can alter your ability to drive safely.  If you have any concerns once you are home that you are not improving or are in fact getting worse before you can make it to your follow-up appointment, please do not hesitate to call [phone] and come back for further evaluation.  Merrily BrittleNeil Catarina Huntley, MD  Results for orders placed or performed during the hospital encounter of 04/04/17  Basic metabolic panel  Result Value Ref Range   Sodium 134 (L) 135 - 145 mmol/L   Potassium 3.8 3.5 - 5.1 mmol/L   Chloride 102 101 - 111 mmol/L   CO2 21 (L) 22 - 32 mmol/L   Glucose, Bld 95 65 - 99 mg/dL   BUN 25 (H) 6 - 20 mg/dL   Creatinine, Ser 1.610.88 0.61 - 1.24 mg/dL   Calcium 9.6 8.9 - 09.610.3 mg/dL   GFR calc non Af Amer >60 >60 mL/min   GFR calc Af Amer >60 >60 mL/min   Anion gap 11 5 - 15  Hepatic function panel  Result Value Ref Range   Total Protein 7.1 6.5 - 8.1 g/dL   Albumin 3.9 3.5 - 5.0 g/dL   AST 22 15 - 41 U/L   ALT 18 17 - 63 U/L   Alkaline Phosphatase 79 38 - 126 U/L   Total Bilirubin 0.3 0.3 - 1.2 mg/dL   Bilirubin, Direct <0.4<0.1 (L) 0.1 - 0.5 mg/dL   Indirect Bilirubin NOT CALCULATED 0.3 - 0.9 mg/dL  Lipase, blood  Result Value Ref Range   Lipase 21 11 - 51 U/L  Troponin I  Result Value Ref Range    Troponin I <0.03 <0.03 ng/mL  CBC with Differential  Result Value Ref Range   WBC 8.6 3.8 - 10.6 K/uL   RBC 5.03 4.40 - 5.90 MIL/uL   Hemoglobin 14.8 13.0 - 18.0 g/dL   HCT 54.042.7 98.140.0 - 19.152.0 %   MCV 85.0 80.0 - 100.0 fL   MCH 29.4 26.0 - 34.0 pg   MCHC 34.6 32.0 - 36.0 g/dL   RDW 47.816.5 (H) 29.511.5 - 62.114.5 %   Platelets 334 150 - 440 K/uL   Neutrophils Relative % 61 %   Neutro Abs 5.2 1.4 - 6.5 K/uL   Lymphocytes Relative 24 %   Lymphs Abs 2.1 1.0 - 3.6 K/uL   Monocytes Relative 10 %   Monocytes Absolute 0.8 0.2 - 1.0 K/uL   Eosinophils Relative 4 %   Eosinophils Absolute 0.4 0 - 0.7 K/uL   Basophils Relative 1 %   Basophils Absolute 0.1 0 - 0.1 K/uL  Valproic acid level  Result Value Ref Range   Valproic Acid Lvl <10 (L) 50.0 - 100.0 ug/mL   Dg Chest Port 1 View  Result Date: 04/04/2017 CLINICAL  DATA:  Sudden onset of left-sided chest pain. EXAM: PORTABLE CHEST 1 VIEW COMPARISON:  Radiograph and CT 12/05/2016 FINDINGS: Decreased enlargement of cardiac silhouette from prior exam. Mild hyperinflated. No pulmonary edema, consolidation, large pleural effusion or pneumothorax. Probable remote right clavicle fracture. IMPRESSION: Mild hyperinflation likely secondary to emphysema/COPD. Decrease cardiac prominence from prior. Electronically Signed   By: Rubye OaksMelanie  Ehinger M.D.   On: 04/04/2017 03:22

## 2017-04-04 NOTE — ED Notes (Signed)
Pt. Going home with brother. 

## 2017-05-25 IMAGING — MR MR HEAD WO/W CM
13 of 14 series · 42 of 48 positions shown · IV contrast (12ML MULTIHANCE)
Comparison: Head CT 06/07/2015

CLINICAL DATA: Episode of facial numbness and presyncope earlier
today while sitting in the car. History of seizures.

EXAM:
MRI HEAD WITHOUT AND WITH CONTRAST
TECHNIQUE: Multiplanar, multiecho pulse sequences of the brain and surrounding
structures were obtained without and with intravenous contrast.
CONTRAST:  12mL MULTIHANCE GADOBENATE DIMEGLUMINE 529 MG/ML IV SOLN

[Series 2: GRE · sagittal · 5.0mm · 0.45mm/px · 2 of 27 slices shown]
[im 1/27]
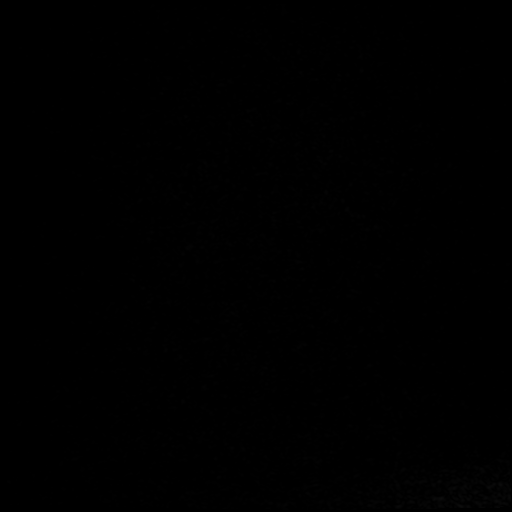
[im 14/27]
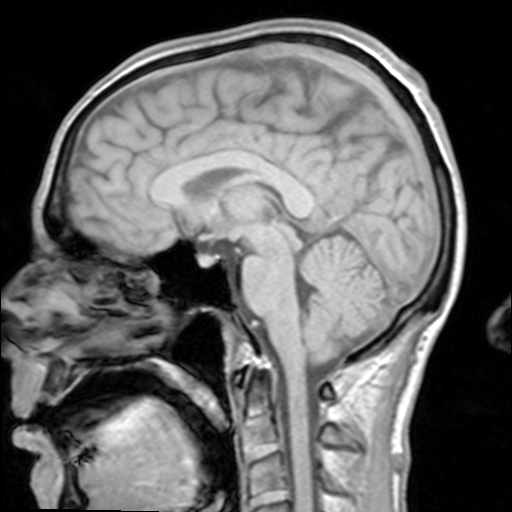

[Series 4: DWI · axial · 3.0mm · 1.80mm/px · z∈[-78,+82]mm · 5 of 47 slices shown (1 of 4)]
[im 1/47]
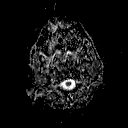
[im 12/47]
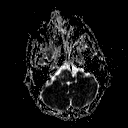
[im 24/47]
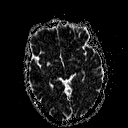
[im 35/47]
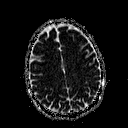
[im 47/47]
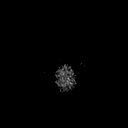

[Series 6: DWI · coronal · 3.0mm · 1.80mm/px · 4 of 49 slices shown (2 of 4)]
[im 1/49]
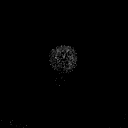
[im 17/49]
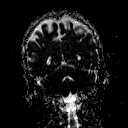
[im 33/49]
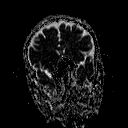
[im 49/49]
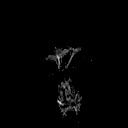

[Series 7: T2 · axial · 5.0mm · 0.45mm/px · z∈[-73,+81]mm · 2 of 25 slices shown (1 of 4)]
[im 1/25]
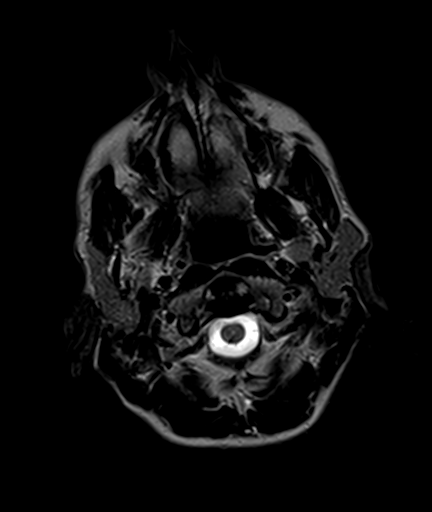
[im 25/25]
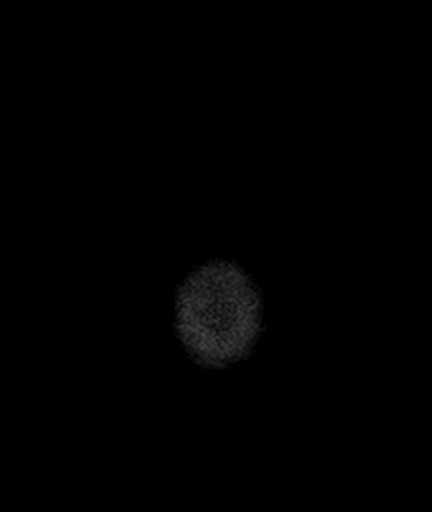

[Series 8: FLAIR · axial · 5.0mm · 0.45mm/px · z∈[-73,+82]mm · 2 of 25 slices shown]
[im 1/25]
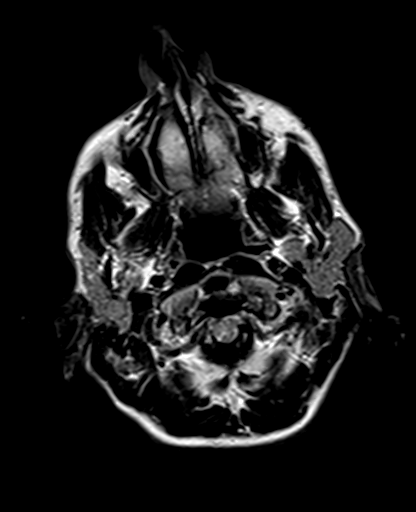
[im 25/25]
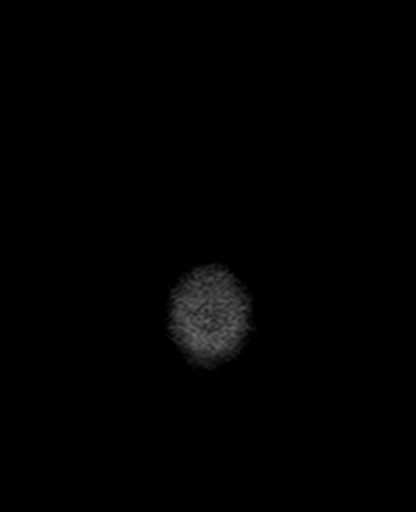

[Series 9: T2 · axial · 5.0mm · 0.45mm/px · z∈[-73,+82]mm · 2 of 25 slices shown (2 of 4)]
[im 1/25]
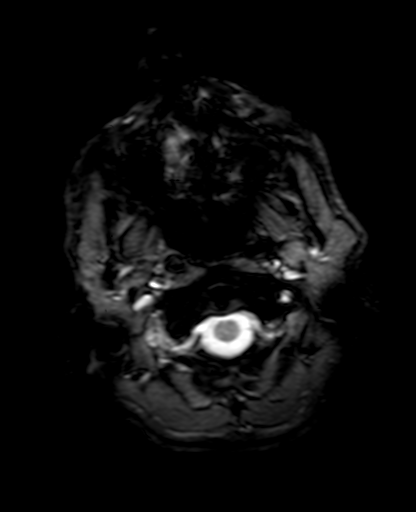
[im 25/25]
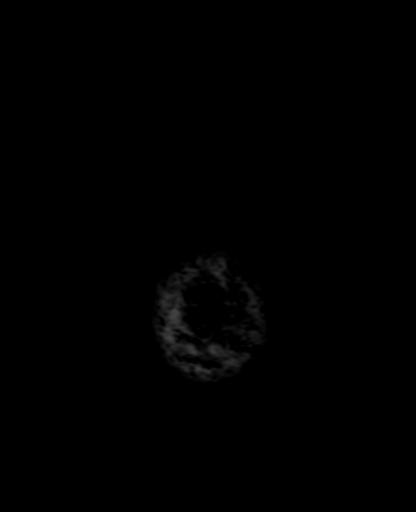

[Series 11: T2 · coronal · 4.0mm · 0.60mm/px · 3 of 31 slices shown (3 of 4)]
[im 1/31]
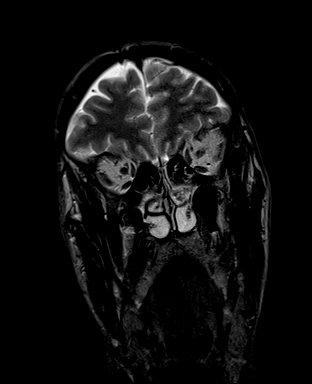
[im 16/31]
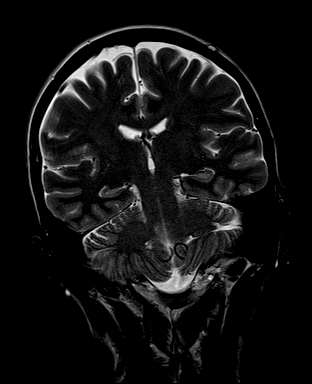
[im 31/31]
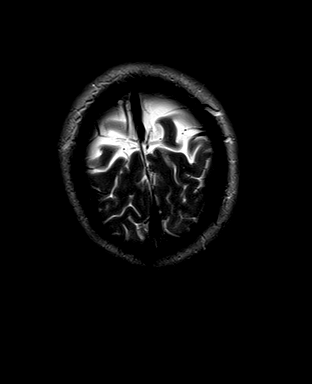

[Series 12: T2 · coronal · 5.0mm · 0.45mm/px · 3 of 31 slices shown (4 of 4)]
[im 1/31]
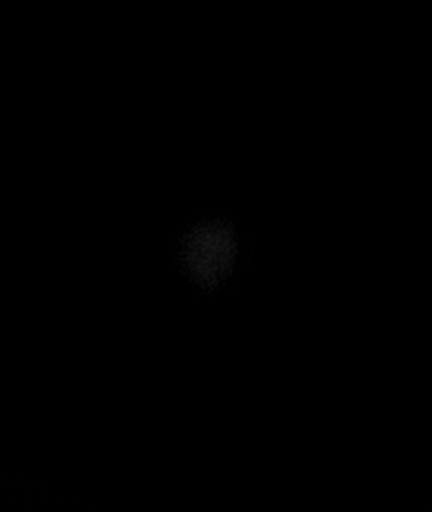
[im 16/31]
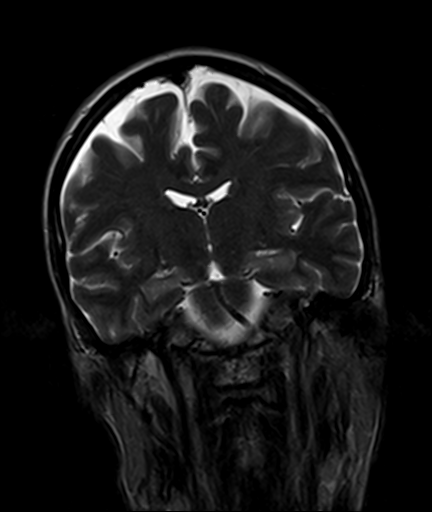
[im 31/31]
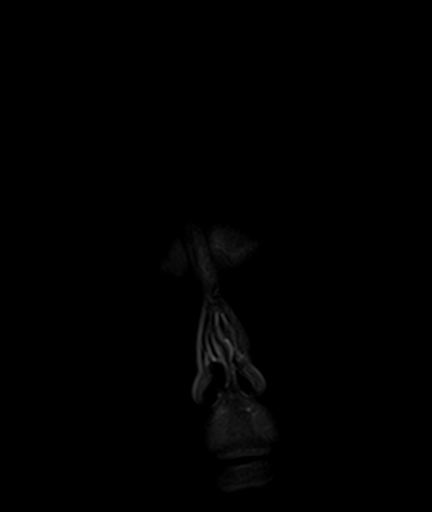

[Series 13: T1 post-contrast · axial · 3.0mm · 1.00mm/px · z∈[-82,+93]mm · 5 of 60 slices shown (1 of 3)]
[im 1/60]
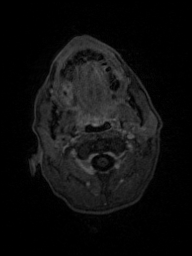
[im 15/60]
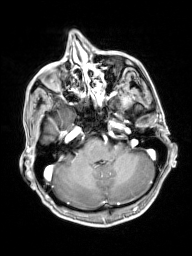
[im 30/60]
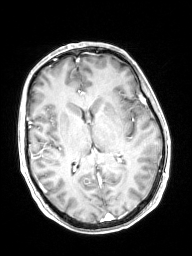
[im 45/60]
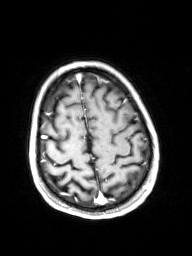
[im 60/60]
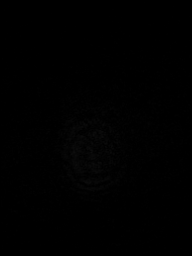

[Series 14: T1 post-contrast · coronal · 5.0mm · 0.43mm/px · 3 of 31 slices shown (2 of 3)]
[im 1/31]
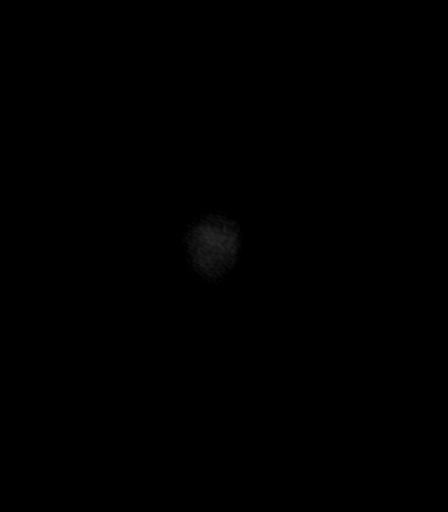
[im 16/31]
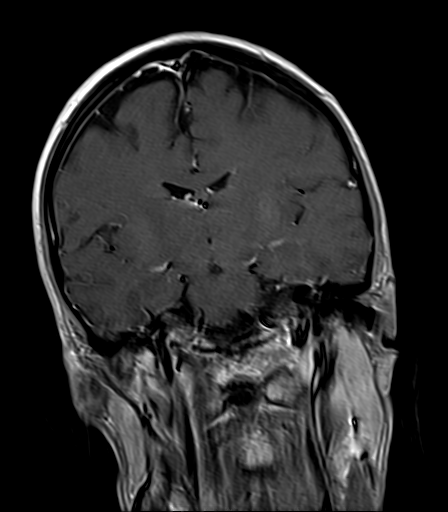
[im 31/31]
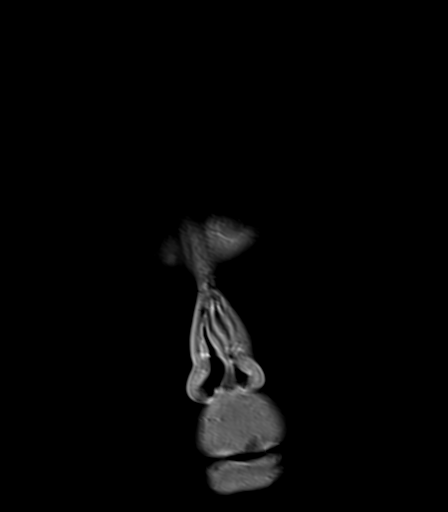

[Series 15: T1 post-contrast · sagittal · 5.0mm · 0.45mm/px · 2 of 27 slices shown (3 of 3)]
[im 1/27]
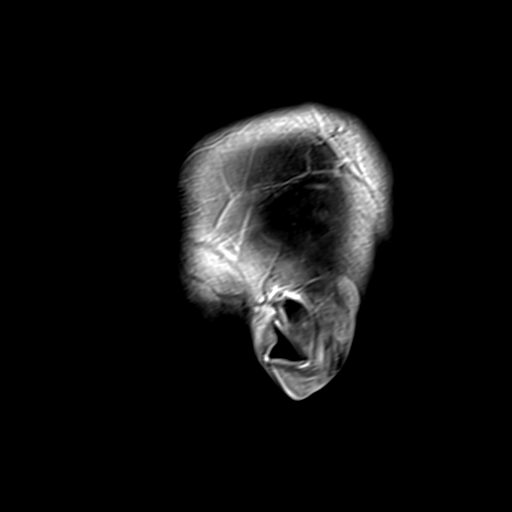
[im 27/27]
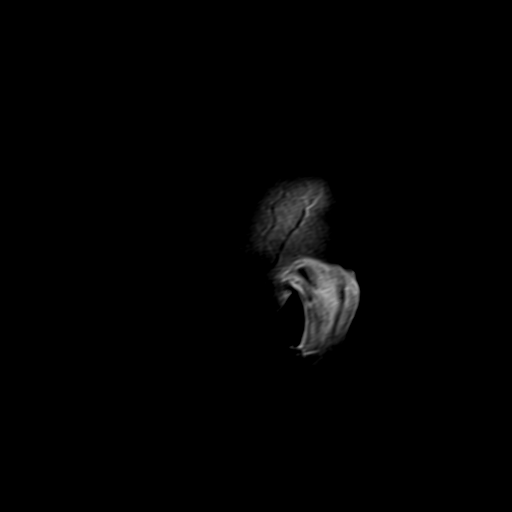

[Series 100: DWI · axial · 3.0mm · 1.80mm/px · z∈[-78,+82]mm · 5 of 55 slices shown (3 of 4)]
[im 1/55]
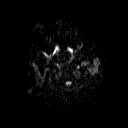
[im 14/55]
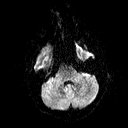
[im 28/55]
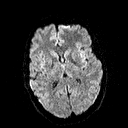
[im 41/55]
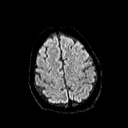
[im 55/55]
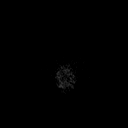

[Series 101: DWI · coronal · 3.0mm · 1.80mm/px · 4 of 49 slices shown (4 of 4)]
[im 1/49]
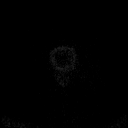
[im 17/49]
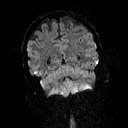
[im 33/49]
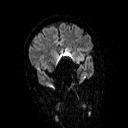
[im 49/49]
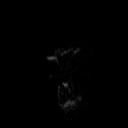

[42 of 48 positions shown; findings below may reference images not displayed]

FINDINGS: Dedicated thin section imaging through the temporal lobes
demonstrates normal volume of the hippocampi without definite signal
abnormality identified. There is no evidence of heterotopia.

There is no evidence of acute infarct, intracranial hemorrhage,
midline shift, or extra-axial fluid collection. Ventricles and sulci
are normal. There is a well-circumscribed 3 mm cystic focus in the
anteroinferior right frontal lobe near the gray-white junction
(series 11, image 27 and series 8, image 10). This follows CSF
signal intensity and is without enhancement. Immediately medial to
this is a 4 mm region of mild T2/FLAIR hyperintensity in the
adjacent white matter. The brain is otherwise normal in signal.

Orbits are unremarkable. No significant inflammatory disease is seen
in the paranasal sinuses or mastoid air cells. Major intracranial
vascular flow voids are preserved.
IMPRESSION: 1. No acute intracranial abnormality.
2. 3 mm cystic focus in the right frontal lobe, nonspecific but
overall benign in appearance. Consider a follow-up brain MRI in 1
year to ensure stability.

## 2017-06-20 ENCOUNTER — Other Ambulatory Visit: Payer: Self-pay

## 2017-06-20 ENCOUNTER — Emergency Department
Admission: EM | Admit: 2017-06-20 | Discharge: 2017-06-20 | Disposition: A | Discharge status: Home / Self Care | Payer: Self-pay | Attending: Emergency Medicine | Admitting: Emergency Medicine

## 2017-06-20 ENCOUNTER — Emergency Department: Payer: Self-pay

## 2017-06-20 ENCOUNTER — Encounter: Payer: Self-pay | Admitting: Emergency Medicine

## 2017-06-20 DIAGNOSIS — N201 Calculus of ureter: Secondary | ICD-10-CM

## 2017-06-20 DIAGNOSIS — Z79899 Other long term (current) drug therapy: Secondary | ICD-10-CM | POA: Insufficient documentation

## 2017-06-20 DIAGNOSIS — F17210 Nicotine dependence, cigarettes, uncomplicated: Secondary | ICD-10-CM | POA: Insufficient documentation

## 2017-06-20 DIAGNOSIS — N132 Hydronephrosis with renal and ureteral calculous obstruction: Secondary | ICD-10-CM | POA: Insufficient documentation

## 2017-06-20 LAB — CBC
HCT: 42.8 % (ref 40.0–52.0)
Hemoglobin: 14.5 g/dL (ref 13.0–18.0)
MCH: 29.8 pg (ref 26.0–34.0)
MCHC: 33.8 g/dL (ref 32.0–36.0)
MCV: 88.2 fL (ref 80.0–100.0)
PLATELETS: 264 10*3/uL (ref 150–440)
RBC: 4.86 MIL/uL (ref 4.40–5.90)
RDW: 15.4 % — AB (ref 11.5–14.5)
WBC: 12.6 10*3/uL — ABNORMAL HIGH (ref 3.8–10.6)

## 2017-06-20 LAB — URINALYSIS, COMPLETE (UACMP) WITH MICROSCOPIC
BILIRUBIN URINE: NEGATIVE
Glucose, UA: NEGATIVE mg/dL
Ketones, ur: 20 mg/dL — AB
LEUKOCYTES UA: NEGATIVE
NITRITE: NEGATIVE
PH: 6 (ref 5.0–8.0)
Protein, ur: NEGATIVE mg/dL
Specific Gravity, Urine: 1.02 (ref 1.005–1.030)
Squamous Epithelial / LPF: NONE SEEN

## 2017-06-20 LAB — BASIC METABOLIC PANEL
Anion gap: 11 (ref 5–15)
BUN: 22 mg/dL — AB (ref 6–20)
CO2: 21 mmol/L — AB (ref 22–32)
CREATININE: 1.21 mg/dL (ref 0.61–1.24)
Calcium: 9.2 mg/dL (ref 8.9–10.3)
Chloride: 104 mmol/L (ref 101–111)
GFR calc Af Amer: >60 mL/min (ref >60)
GFR calc non Af Amer: >60 mL/min (ref >60)
Glucose, Bld: 108 mg/dL — ABNORMAL HIGH (ref 65–99)
Potassium: 3.8 mmol/L (ref 3.5–5.1)
Sodium: 136 mmol/L (ref 135–145)

## 2017-06-20 MED ORDER — MORPHINE SULFATE (PF) 4 MG/ML IV SOLN
4.0000 mg | Freq: Once | INTRAVENOUS | Status: AC
  Administered 2017-06-20: 4 mg via INTRAVENOUS
  Filled 2017-06-20: qty 1

## 2017-06-20 MED ORDER — TAMSULOSIN HCL 0.4 MG PO CAPS: 0.4000 mg | ORAL_CAPSULE | Freq: Every day | ORAL | 0 refills | Status: AC

## 2017-06-20 MED ORDER — KETOROLAC TROMETHAMINE 30 MG/ML IJ SOLN
30.0000 mg | Freq: Once | INTRAMUSCULAR | Status: AC
  Administered 2017-06-20: 30 mg via INTRAVENOUS
  Filled 2017-06-20: qty 1

## 2017-06-20 MED ORDER — HYDROCODONE-ACETAMINOPHEN 5-325 MG PO TABS: 1.0000 | ORAL_TABLET | Freq: Four times a day (QID) | ORAL | 0 refills | Status: DC | PRN

## 2017-06-20 MED ORDER — ONDANSETRON HCL 4 MG/2ML IJ SOLN
4.0000 mg | Freq: Once | INTRAMUSCULAR | Status: AC
  Administered 2017-06-20: 4 mg via INTRAVENOUS
  Filled 2017-06-20: qty 2

## 2017-06-20 MED ORDER — ONDANSETRON 4 MG PO TBDP: 4.0000 mg | ORAL_TABLET | Freq: Three times a day (TID) | ORAL | 0 refills | Status: DC | PRN

## 2017-06-20 MED ORDER — NAPROXEN 500 MG PO TABS: 500.0000 mg | ORAL_TABLET | Freq: Two times a day (BID) | ORAL | 2 refills | Status: DC

## 2017-06-20 MED ORDER — SODIUM CHLORIDE 0.9 % IV SOLN
1000.0000 mL | Freq: Once | INTRAVENOUS | Status: AC
  Administered 2017-06-20: 1000 mL via INTRAVENOUS

## 2017-06-20 NOTE — ED Provider Notes (Signed)
[facility] Emergency Department Provider Note   ____________________________________________    I have reviewed the triage vital signs and the nursing notes.   HISTORY  Chief Complaint Flank Pain     HPI Justin Donaldson is a 42 y.o. male who presents with complaints of right flank pain.  Patient reports this started approximately 7 PM last night and has been continuous since then.  He has never had this before.  Denies hematuria.  Denies dysuria.  No fever.  Mild nausea no vomiting.  Has a history of anxiety and TBI.  No injury to the area or trauma..  Reports the pain is severe and sharp, has not taken anything for this  Past Medical History:  Diagnosis Date  . Seizures (HCC)   . Traumatic brain injury [facility])     Patient Active Problem List   Diagnosis Date Noted  . Chest pain 12/05/2016    History reviewed. No pertinent surgical history.  Prior to Admission medications   Medication Sig Start Date End Date Taking? Authorizing Provider  colchicine 0.6 MG tablet Take 1 tablet (0.6 mg total) by mouth 2 (two) times daily. 12/07/16 02/06/17  Adrian Saran, MD  divalproex (DEPAKOTE) 500 MG DR tablet Take 500 mg by mouth 2 (two) times daily.    [provider]  HYDROcodone-acetaminophen (NORCO/VICODIN) 5-325 MG tablet Take 1 tablet by mouth every 6 (six) hours as needed for severe pain. 06/20/17   Jene Every, MD  hydrOXYzine (ATARAX/VISTARIL) 50 MG tablet Take 50 mg by mouth every 6 (six) hours as needed. 08/06/16   [provider]  naproxen (NAPROSYN) 500 MG tablet Take 1 tablet (500 mg total) by mouth 2 (two) times daily with a meal. 06/20/17   Jene Every, MD  nicotine (NICODERM CQ - DOSED IN MG/24 HOURS) 21 mg/24hr patch Place 1 patch (21 mg total) onto the skin daily. Patient not taking: Reported on 04/04/2017 12/07/16   Adrian Saran, MD  ondansetron (ZOFRAN ODT) 4 MG disintegrating tablet Take 1 tablet (4 mg total) by mouth every  8 (eight) hours as needed for nausea or vomiting. 06/20/17   Jene Every, MD  propranolol (INDERAL) 10 MG tablet Take 10 mg by mouth 3 (three) times daily.    [provider]  ranitidine (ZANTAC) 150 MG tablet Take 1 tablet (150 mg total) by mouth 2 (two) times daily. Patient not taking: Reported on 04/04/2017 12/07/16 12/07/17  Adrian Saran, MD  sertraline (ZOLOFT) 100 MG tablet Take 200 mg by mouth daily. 10/23/16 10/23/17  [provider]  tamsulosin (FLOMAX) 0.4 MG CAPS capsule Take 1 capsule (0.4 mg total) by mouth daily. 06/20/17   Jene Every, MD     Allergies Patient has no known allergies.  Family History  Problem Relation Age of Onset  . CAD Father     Social History Social History   Tobacco Use  . Smoking status: Current Every Day Smoker    Packs/day: 1.00    Types: Cigarettes  . Smokeless tobacco: Never Used  Substance Use Topics  . Alcohol use: Yes    Comment: occ  . Drug use: Yes    Types: Marijuana    Comment: pt states he quit smoking marijuana    Review of Systems  Constitutional: No fever/chills Eyes: No visual changes.  ENT: No sore throat. Cardiovascular: Denies chest pain. Respiratory: Denies shortness of breath. Gastrointestinal: As above Genitourinary: Negative for dysuria.  No hematuria Musculoskeletal: Negative for back pain. Skin: Negative  for rash. Neurological: Negative for headaches    ____________________________________________   PHYSICAL EXAM:  VITAL SIGNS: ED Triage Vitals  Enc Vitals Group     BP 06/20/17 1934 113/85     Pulse Rate 06/20/17 1934 85     Resp 06/20/17 1934 18     Temp 06/20/17 1934 97.9 F (36.6 C)     Temp Source 06/20/17 1934 Oral     SpO2 06/20/17 1934 99 %     Weight 06/20/17 1937 70.3 kg (155 lb)     Height 06/20/17 1937 1.778 m (5\' 10" )     Head Circumference --      Peak Flow --      Pain Score 06/20/17 1936 9     Pain Loc --      Pain Edu? --      Excl. in GC? --      Constitutional: Alert and oriented. Eyes: Conjunctivae are normal.   Nose: No congestion/rhinnorhea.  Cardiovascular: Normal rate, regular rhythm.   Good peripheral circulation. Respiratory: Normal respiratory effort.  No retractions. Gastrointestinal: Soft and nontender. No distention.  No CVA tenderness. Genitourinary: deferred Musculoskeletal: Warm and well perfused Neurologic:  Normal speech and language. No gross focal neurologic deficits are appreciated.  Skin:  Skin is warm, dry and intact. No rash noted. Psychiatric: Mood and affect are normal. Speech and behavior are normal.  ____________________________________________   LABS (all labs ordered are listed, but only abnormal results are displayed)  Labs Reviewed  URINALYSIS, COMPLETE (UACMP) WITH MICROSCOPIC - Abnormal; Notable for the following components:      Result Value   Color, Urine YELLOW (*)    APPearance HAZY (*)    Hgb urine dipstick SMALL (*)    Ketones, ur 20 (*)    Bacteria, UA RARE (*)    All other components within normal limits  BASIC METABOLIC PANEL - Abnormal; Notable for the following components:   CO2 21 (*)    Glucose, Bld 108 (*)    BUN 22 (*)    All other components within normal limits  CBC - Abnormal; Notable for the following components:   WBC 12.6 (*)    RDW 15.4 (*)    All other components within normal limits   ____________________________________________  EKG  None ____________________________________________  RADIOLOGY  None ____________________________________________   PROCEDURES  Procedure(s) performed: No  Procedures   Critical Care performed: No ____________________________________________   INITIAL IMPRESSION / ASSESSMENT AND PLAN / ED COURSE  Pertinent labs & imaging results that were available during my care of the patient were reviewed by me and considered in my medical decision making (see chart for details).  Patient presents with abrupt onset  right flank pain, no history of ureterolithiasis.  Denies hematuria.  Will check labs, urinalysis give IV Toradol IV fluids, obtain CT renal stone study and reevaluate.   patient with mild improvement with IV Toradol, CT renal stone study demonstrates 3 mm distal ureteral stone we will give IV morphine and IV Zofran  Patient had near complete resolution of pain with IV morphine and Zofran, urinalysis reassuring, no evidence of infection, will discharge with analgesics urology follow-up, return precautions discussed.    ____________________________________________   FINAL CLINICAL IMPRESSION(S) / ED DIAGNOSES  Final diagnoses:  Ureterolithiasis        Note:  This document was prepared using Dragon voice recognition software and may include unintentional dictation errors.    Jene EveryKinner, Mikaella Escalona, MD 06/20/17 2218

## 2017-06-20 NOTE — ED Triage Notes (Signed)
First Nurse: patient ambulatory to stat desk. Patient with complaint of right flank pain that started last night. Patient states that he has been vomiting.

## 2017-06-20 NOTE — ED Notes (Signed)
Patient transported to CT 

## 2017-06-20 NOTE — ED Triage Notes (Signed)
Pt arrives POV to triage with c/o right flank pain. Pt is grabbing his upper right quadrant as well at this time.

## 2017-06-20 NOTE — ED Notes (Signed)
Pt returned from CT °

## 2019-09-02 ENCOUNTER — Emergency Department
Admission: EM | Admit: 2019-09-02 | Discharge: 2019-09-02 | Disposition: A | Discharge status: Home / Self Care | Payer: Self-pay | Attending: Emergency Medicine | Admitting: Emergency Medicine

## 2019-09-02 ENCOUNTER — Other Ambulatory Visit: Payer: Self-pay

## 2019-09-02 DIAGNOSIS — R1110 Vomiting, unspecified: Secondary | ICD-10-CM | POA: Insufficient documentation

## 2019-09-02 DIAGNOSIS — Z5321 Procedure and treatment not carried out due to patient leaving prior to being seen by health care provider: Secondary | ICD-10-CM | POA: Insufficient documentation

## 2019-09-02 LAB — CBC
HCT: 47.9 % (ref 39.0–52.0)
Hemoglobin: 16.5 g/dL (ref 13.0–17.0)
MCH: 30.9 pg (ref 26.0–34.0)
MCHC: 34.4 g/dL (ref 30.0–36.0)
MCV: 89.7 fL (ref 80.0–100.0)
Platelets: 296 10*3/uL (ref 150–400)
RBC: 5.34 MIL/uL (ref 4.22–5.81)
RDW: 13.2 % (ref 11.5–15.5)
WBC: 10.5 10*3/uL (ref 4.0–10.5)
nRBC: 0 % (ref 0.0–0.2)

## 2019-09-02 LAB — COMPREHENSIVE METABOLIC PANEL
ALT: 20 U/L (ref 0–44)
AST: 19 U/L (ref 15–41)
Albumin: 4.2 g/dL (ref 3.5–5.0)
Alkaline Phosphatase: 80 U/L (ref 38–126)
Anion gap: 13 (ref 5–15)
BUN: 20 mg/dL (ref 6–20)
CO2: 27 mmol/L (ref 22–32)
Calcium: 9.4 mg/dL (ref 8.9–10.3)
Chloride: 95 mmol/L — ABNORMAL LOW (ref 98–111)
Creatinine, Ser: 0.86 mg/dL (ref 0.61–1.24)
GFR calc Af Amer: >60 mL/min (ref >60)
GFR calc non Af Amer: >60 mL/min (ref >60)
Glucose, Bld: 117 mg/dL — ABNORMAL HIGH (ref 70–99)
Potassium: 3.8 mmol/L (ref 3.5–5.1)
Sodium: 135 mmol/L (ref 135–145)
Total Bilirubin: 0.6 mg/dL (ref 0.3–1.2)
Total Protein: 8 g/dL (ref 6.5–8.1)

## 2019-09-02 LAB — URINALYSIS, COMPLETE (UACMP) WITH MICROSCOPIC
Bacteria, UA: NONE SEEN
Bilirubin Urine: NEGATIVE
Glucose, UA: NEGATIVE mg/dL
Ketones, ur: 5 mg/dL — AB
Leukocytes,Ua: NEGATIVE
Nitrite: NEGATIVE
Protein, ur: 30 mg/dL — AB
Specific Gravity, Urine: 1.029 (ref 1.005–1.030)
pH: 5 (ref 5.0–8.0)

## 2019-09-02 LAB — LIPASE, BLOOD: Lipase: 23 U/L (ref 11–51)

## 2019-09-02 NOTE — ED Notes (Signed)
Pt to desk while this RN and registration speaking with family member of another patient, registration explained to patient to please wait and allow staff to speak with family member. Pt then became angry states, "i'm fucking leaving" and storms out of the ED.

## 2019-09-02 NOTE — ED Triage Notes (Signed)
Reports emesis and diarrhea without abdominal pain since Tuesday. Pt alert and oriented X4, cooperative, RR even and unlabored, color WNL. Pt in NAD. Wants to be tested for COVID

## 2022-01-18 ENCOUNTER — Emergency Department: Payer: Self-pay

## 2022-01-18 ENCOUNTER — Encounter: Payer: Self-pay | Admitting: Emergency Medicine

## 2022-01-18 ENCOUNTER — Other Ambulatory Visit: Payer: Self-pay

## 2022-01-18 ENCOUNTER — Emergency Department
Admission: EM | Admit: 2022-01-18 | Discharge: 2022-01-18 | Disposition: A | Discharge status: Home / Self Care | Payer: Self-pay | Attending: Emergency Medicine | Admitting: Emergency Medicine

## 2022-01-18 DIAGNOSIS — R1013 Epigastric pain: Secondary | ICD-10-CM

## 2022-01-18 DIAGNOSIS — K529 Noninfective gastroenteritis and colitis, unspecified: Secondary | ICD-10-CM | POA: Insufficient documentation

## 2022-01-18 LAB — URINALYSIS, ROUTINE W REFLEX MICROSCOPIC
Bilirubin Urine: NEGATIVE
Glucose, UA: NEGATIVE mg/dL
Hgb urine dipstick: NEGATIVE
Ketones, ur: 5 mg/dL — AB
Leukocytes,Ua: NEGATIVE
Nitrite: NEGATIVE
Protein, ur: 100 mg/dL — AB
Specific Gravity, Urine: 1.027 (ref 1.005–1.030)
Squamous Epithelial / HPF: NONE SEEN (ref 0–5)
pH: 5 (ref 5.0–8.0)

## 2022-01-18 LAB — COMPREHENSIVE METABOLIC PANEL
ALT: 203 U/L — ABNORMAL HIGH (ref 0–44)
AST: 86 U/L — ABNORMAL HIGH (ref 15–41)
Albumin: 3.6 g/dL (ref 3.5–5.0)
Alkaline Phosphatase: 96 U/L (ref 38–126)
Anion gap: 10 (ref 5–15)
BUN: 9 mg/dL (ref 6–20)
CO2: 28 mmol/L (ref 22–32)
Calcium: 8.8 mg/dL — ABNORMAL LOW (ref 8.9–10.3)
Chloride: 97 mmol/L — ABNORMAL LOW (ref 98–111)
Creatinine, Ser: 0.71 mg/dL (ref 0.61–1.24)
GFR, Estimated: >60 mL/min (ref >60)
Glucose, Bld: 107 mg/dL — ABNORMAL HIGH (ref 70–99)
Potassium: 4.6 mmol/L (ref 3.5–5.1)
Sodium: 135 mmol/L (ref 135–145)
Total Bilirubin: 0.7 mg/dL (ref 0.3–1.2)
Total Protein: 6.6 g/dL (ref 6.5–8.1)

## 2022-01-18 LAB — CBC
HCT: 47.4 % (ref 39.0–52.0)
Hemoglobin: 16.3 g/dL (ref 13.0–17.0)
MCH: 30.2 pg (ref 26.0–34.0)
MCHC: 34.4 g/dL (ref 30.0–36.0)
MCV: 87.9 fL (ref 80.0–100.0)
Platelets: 319 10*3/uL (ref 150–400)
RBC: 5.39 MIL/uL (ref 4.22–5.81)
RDW: 13.5 % (ref 11.5–15.5)
WBC: 5.9 10*3/uL (ref 4.0–10.5)
nRBC: 0 % (ref 0.0–0.2)

## 2022-01-18 LAB — LIPASE, BLOOD: Lipase: 32 U/L (ref 11–51)

## 2022-01-18 MED ORDER — ONDANSETRON HCL 4 MG/2ML IJ SOLN
4.0000 mg | Freq: Once | INTRAMUSCULAR | Status: AC
  Administered 2022-01-18: 4 mg via INTRAVENOUS
  Filled 2022-01-18: qty 2

## 2022-01-18 MED ORDER — MORPHINE SULFATE (PF) 4 MG/ML IV SOLN
4.0000 mg | Freq: Once | INTRAVENOUS | Status: AC
  Administered 2022-01-18: 4 mg via INTRAVENOUS
  Filled 2022-01-18: qty 1

## 2022-01-18 MED ORDER — PANTOPRAZOLE SODIUM 40 MG PO TBEC: 40.0000 mg | DELAYED_RELEASE_TABLET | Freq: Every day | ORAL | 0 refills | Status: DC

## 2022-01-18 MED ORDER — IOHEXOL 300 MG/ML  SOLN
100.0000 mL | Freq: Once | INTRAMUSCULAR | Status: AC | PRN
  Administered 2022-01-18: 100 mL via INTRAVENOUS

## 2022-01-18 MED ORDER — SODIUM CHLORIDE 0.9 % IV BOLUS
1000.0000 mL | Freq: Once | INTRAVENOUS | Status: AC
  Administered 2022-01-18: 1000 mL via INTRAVENOUS

## 2022-01-18 MED ORDER — SUCRALFATE 1 G PO TABS: 1.0000 g | ORAL_TABLET | Freq: Four times a day (QID) | ORAL | 0 refills | Status: DC

## 2022-01-18 MED ORDER — OXYCODONE HCL 5 MG PO TABS: 5.0000 mg | ORAL_TABLET | Freq: Four times a day (QID) | ORAL | 0 refills | Status: AC | PRN

## 2022-01-18 MED ORDER — HYDROCODONE-ACETAMINOPHEN 5-325 MG PO TABS
2.0000 | ORAL_TABLET | Freq: Once | ORAL | Status: AC
  Administered 2022-01-18: 2 via ORAL
  Filled 2022-01-18: qty 2

## 2022-01-18 MED ORDER — ONDANSETRON 4 MG PO TBDP: 4.0000 mg | ORAL_TABLET | Freq: Three times a day (TID) | ORAL | 0 refills | Status: DC | PRN

## 2022-01-18 MED ORDER — ALUM & MAG HYDROXIDE-SIMETH 200-200-20 MG/5ML PO SUSP
30.0000 mL | Freq: Once | ORAL | Status: AC
  Administered 2022-01-18: 30 mL via ORAL
  Filled 2022-01-18: qty 30

## 2022-01-18 NOTE — ED Provider Notes (Signed)
I did not participate in care. Dr. Ellender Hose saw and evaluated.   Delman Kitten, MD 01/18/22 [phone]

## 2022-01-18 NOTE — ED Notes (Signed)
Pt to ED complaining of pain under L rib area since 3 weeks. States is vomiting since 3 days ago and has lost 7# this week.

## 2022-01-18 NOTE — ED Provider Notes (Signed)
[facility] Provider Note    Event Date/Time   First MD Initiated Contact with Patient 01/18/22 [phone]     (approximate)   History   Abdominal Pain   HPI  Justin Donaldson is a 46 y.o. male here with left upper abdominal pain.  The patient states that for the last week, he has had initially mild, progressive worsening, aching, throbbing, left upper quadrant abdominal pain.  The pain has been fairly constant.  He has had associated nausea and nonbloody, nonbilious emesis.  The pain has been constant.  He states he has had decreased appetite.  He states he is lost 7 pounds in the last week due to this.  He has had an associated cough.  Pain seems to be fairly constant, denies any specific alleviating or aggravating factors.  Denies history of ulcers.  Denies any history of kidney issues.  Denies any dysuria or hematuria.     Physical Exam   Triage Vital Signs: ED Triage Vitals  Enc Vitals Group     BP 01/18/22 0725 (!) 155/109     Pulse Rate 01/18/22 0725 81     Resp 01/18/22 0725 16     Temp 01/18/22 0725 97.8 F (36.6 C)     Temp Source 01/18/22 0725 Oral     SpO2 01/18/22 0725 95 %     Weight 01/18/22 0724 170 lb (77.1 kg)     Height 01/18/22 0724 5\' 10"  (1.778 m)     Head Circumference --      Peak Flow --      Pain Score 01/18/22 0724 6     Pain Loc --      Pain Edu? --      Excl. in GC? --     Most recent vital signs: Vitals:   01/18/22 0915 01/18/22 1129  BP: 133/88 137/86  Pulse: 79 85  Resp: 14 16  Temp:  97.6 F (36.4 C)  SpO2: 96% 96%     General: Awake, no distress.  CV:  Good peripheral perfusion.  Resp:  Normal effort.  Basilar rales noted in the left lung base. Abd:  No distention.  Moderate left upper quadrant and left flank tenderness.  No overt CVA tenderness. Other:  Mildly dry mucous membranes.   ED Results / Procedures / Treatments   Labs (all labs ordered are listed, but only abnormal results are  displayed) Labs Reviewed  COMPREHENSIVE METABOLIC PANEL - Abnormal; Notable for the following components:      Result Value   Chloride 97 (*)    Glucose, Bld 107 (*)    Calcium 8.8 (*)    AST 86 (*)    ALT 203 (*)    All other components within normal limits  URINALYSIS, ROUTINE W REFLEX MICROSCOPIC - Abnormal; Notable for the following components:   Color, Urine AMBER (*)    APPearance CLOUDY (*)    Ketones, ur 5 (*)    Protein, ur 100 (*)    Bacteria, UA RARE (*)    All other components within normal limits  LIPASE, BLOOD  CBC     EKG Normal sinus rhythm, ventricular rate 81.  PR 144, QRS 80, QTc 422.  No acute ST elevations or depressions.  No EKG evidence of acute ischemia or infarct.   RADIOLOGY Chest x-ray: No acute process CT abdomen/pelvis: No evidence of intestinal obstruction or pneumoperitoneum, mild dilation prominence of mucosal folds suggesting possible mild enteritis   I also  independently reviewed and agree with radiologist interpretations.   PROCEDURES:  Critical Care performed: No   MEDICATIONS ORDERED IN ED: Medications  morphine (PF) 4 MG/ML injection 4 mg (4 mg Intravenous Given 01/18/22 0910)  ondansetron (ZOFRAN) injection 4 mg (4 mg Intravenous Given 01/18/22 0908)  sodium chloride 0.9 % bolus 1,000 mL (0 mLs Intravenous Stopped 01/18/22 1115)  alum & mag hydroxide-simeth (MAALOX/MYLANTA) 200-200-20 MG/5ML suspension 30 mL (30 mLs Oral Given 01/18/22 0910)  iohexol (OMNIPAQUE) 300 MG/ML solution 100 mL (100 mLs Intravenous Contrast Given 01/18/22 1005)  HYDROcodone-acetaminophen (NORCO/VICODIN) 5-325 MG per tablet 2 tablet (2 tablets Oral Given 01/18/22 1125)  ondansetron (ZOFRAN) injection 4 mg (4 mg Intravenous Given 01/18/22 1126)     IMPRESSION / MDM / ASSESSMENT AND PLAN / ED COURSE  I reviewed the triage vital signs and the nursing notes.                               Ddx:  Differential includes the following, with pertinent life- or  limb-threatening emergencies considered:  Peptic ulcer disease, gastritis, transverse or descending colitis, enteritis, obstruction, nephrolithiasis, pyelonephritis, basilar pneumonia, atypical angina  Patient's presentation is most consistent with acute presentation with potential threat to life or bodily function.  MDM:  46 year old well-appearing male here with left upper quadrant abdominal pain.  Clinically, suspect peptic ulcer disease versus gastritis versus musculoskeletal pain.  Pain is markedly improved after GI cocktail here.  Vital signs are stable.  Lab work shows no leukocytosis or anemia.  LFTs mildly elevated, unclear whether this is related or secondary to chronic liver disease.  He has no right upper quadrant tenderness on exam.  Lipase is normal.  Urinalysis shows no evidence of UTI or hematuria to suggest stone.  CT abdomen pelvis obtained, reviewed, shows prominent mucosal folds in the small bowel consistent with possible enteritis.  Chest x-ray shows no acute process.  Patient feels much better after symptomatic treatment.  He is tolerating p.o.  Abdomen is soft.  Will treat for possible gastritis.  Do not suspect enteritis is bacterial based on his otherwise reassuring labs and work-up as well as absence of any signs of invasive infection.  Return precautions given.   MEDICATIONS GIVEN IN ED: Medications  morphine (PF) 4 MG/ML injection 4 mg (4 mg Intravenous Given 01/18/22 0910)  ondansetron (ZOFRAN) injection 4 mg (4 mg Intravenous Given 01/18/22 0908)  sodium chloride 0.9 % bolus 1,000 mL (0 mLs Intravenous Stopped 01/18/22 1115)  alum & mag hydroxide-simeth (MAALOX/MYLANTA) 200-200-20 MG/5ML suspension 30 mL (30 mLs Oral Given 01/18/22 0910)  iohexol (OMNIPAQUE) 300 MG/ML solution 100 mL (100 mLs Intravenous Contrast Given 01/18/22 1005)  HYDROcodone-acetaminophen (NORCO/VICODIN) 5-325 MG per tablet 2 tablet (2 tablets Oral Given 01/18/22 1125)  ondansetron (ZOFRAN) injection  4 mg (4 mg Intravenous Given 01/18/22 1126)     Consults:     EMR reviewed       FINAL CLINICAL IMPRESSION(S) / ED DIAGNOSES   Final diagnoses:  Epigastric pain  Enteritis     Rx / DC Orders   ED Discharge Orders          Ordered    pantoprazole (PROTONIX) 40 MG tablet  Daily        01/18/22 1246    sucralfate (CARAFATE) 1 g tablet  4 times daily        01/18/22 1246    oxyCODONE (ROXICODONE) 5 MG immediate release tablet  Every 6 hours PRN        01/18/22 1246    ondansetron (ZOFRAN-ODT) 4 MG disintegrating tablet  Every 8 hours PRN        01/18/22 1247             Note:  This document was prepared using Dragon voice recognition software and may include unintentional dictation errors.   Duffy Bruce, MD 01/18/22 [phone]

## 2022-01-18 NOTE — ED Triage Notes (Signed)
Patient c/o emesis and left flank pain X 1 week. Reports losing 7lb in the last week. Reports he has had high blood pressure readings at home.

## 2022-01-18 NOTE — ED Triage Notes (Signed)
Pt via POV from home. Pt c/o LUQ abd pain for the past week and vomiting for the past 3 days. States he has lost 7 lbs in the last week. Pt states his blood pressure also has been high with no hx. Pt is A&Ox4 and NAD

## 2022-01-18 NOTE — ED Notes (Signed)
Pt given ginger ale and water for fluid challenge.

## 2022-01-18 NOTE — ED Notes (Signed)
Pt to CT

## 2022-01-18 NOTE — ED Notes (Signed)
Pt to CT now

## 2022-06-02 ENCOUNTER — Ambulatory Visit: Payer: Self-pay | Admitting: Gastroenterology

## 2022-06-02 ENCOUNTER — Encounter: Payer: Self-pay | Admitting: Gastroenterology

## 2023-07-27 ENCOUNTER — Other Ambulatory Visit: Payer: Self-pay

## 2023-07-27 DIAGNOSIS — R197 Diarrhea, unspecified: Secondary | ICD-10-CM | POA: Diagnosis not present

## 2023-07-27 DIAGNOSIS — R1084 Generalized abdominal pain: Secondary | ICD-10-CM | POA: Insufficient documentation

## 2023-07-27 DIAGNOSIS — R112 Nausea with vomiting, unspecified: Secondary | ICD-10-CM | POA: Insufficient documentation

## 2023-07-27 LAB — CBC
HCT: 43.1 % (ref 39.0–52.0)
Hemoglobin: 15 g/dL (ref 13.0–17.0)
MCH: 30.7 pg (ref 26.0–34.0)
MCHC: 34.8 g/dL (ref 30.0–36.0)
MCV: 88.3 fL (ref 80.0–100.0)
Platelets: 229 10*3/uL (ref 150–400)
RBC: 4.88 MIL/uL (ref 4.22–5.81)
RDW: 13.3 % (ref 11.5–15.5)
WBC: 7.1 10*3/uL (ref 4.0–10.5)
nRBC: 0 % (ref 0.0–0.2)

## 2023-07-27 LAB — LIPASE, BLOOD: Lipase: 39 U/L (ref 11–51)

## 2023-07-27 LAB — COMPREHENSIVE METABOLIC PANEL WITH GFR
ALT: 22 U/L (ref 0–44)
AST: 20 U/L (ref 15–41)
Albumin: 3.1 g/dL — ABNORMAL LOW (ref 3.5–5.0)
Alkaline Phosphatase: 64 U/L (ref 38–126)
Anion gap: 11 (ref 5–15)
BUN: 10 mg/dL (ref 6–20)
CO2: 27 mmol/L (ref 22–32)
Calcium: 8.4 mg/dL — ABNORMAL LOW (ref 8.9–10.3)
Chloride: 97 mmol/L — ABNORMAL LOW (ref 98–111)
Creatinine, Ser: 0.66 mg/dL (ref 0.61–1.24)
GFR, Estimated: >60 mL/min (ref >60)
Glucose, Bld: 94 mg/dL (ref 70–99)
Potassium: 4.3 mmol/L (ref 3.5–5.1)
Sodium: 135 mmol/L (ref 135–145)
Total Bilirubin: 0.5 mg/dL (ref 0.0–1.2)
Total Protein: 6.4 g/dL — ABNORMAL LOW (ref 6.5–8.1)

## 2023-07-27 NOTE — ED Triage Notes (Signed)
 Pt reports generalized abd pain x4 days, pt reports he has n/v/d. Denies new cough congestion or fever.

## 2023-07-28 ENCOUNTER — Emergency Department: Payer: Self-pay

## 2023-07-28 ENCOUNTER — Emergency Department
Admission: EM | Admit: 2023-07-28 | Discharge: 2023-07-28 | Disposition: A | Discharge status: Home / Self Care | Payer: Self-pay | Attending: Emergency Medicine | Admitting: Emergency Medicine

## 2023-07-28 DIAGNOSIS — R1084 Generalized abdominal pain: Secondary | ICD-10-CM

## 2023-07-28 DIAGNOSIS — R112 Nausea with vomiting, unspecified: Secondary | ICD-10-CM

## 2023-07-28 LAB — URINALYSIS, ROUTINE W REFLEX MICROSCOPIC
Bilirubin Urine: NEGATIVE
Glucose, UA: NEGATIVE mg/dL
Hgb urine dipstick: NEGATIVE
Ketones, ur: NEGATIVE mg/dL
Leukocytes,Ua: NEGATIVE
Nitrite: NEGATIVE
Protein, ur: 30 mg/dL — AB
Specific Gravity, Urine: 1.023 (ref 1.005–1.030)
Squamous Epithelial / HPF: 0 /HPF (ref 0–5)
pH: 7 (ref 5.0–8.0)

## 2023-07-28 MED ORDER — ONDANSETRON HCL 4 MG/2ML IJ SOLN
4.0000 mg | Freq: Once | INTRAMUSCULAR | Status: AC
  Administered 2023-07-28: 4 mg via INTRAVENOUS
  Filled 2023-07-28: qty 2

## 2023-07-28 MED ORDER — ONDANSETRON 4 MG PO TBDP: 4.0000 mg | ORAL_TABLET | Freq: Three times a day (TID) | ORAL | 0 refills | Status: DC | PRN

## 2023-07-28 MED ORDER — MORPHINE SULFATE (PF) 4 MG/ML IV SOLN
4.0000 mg | Freq: Once | INTRAVENOUS | Status: AC
  Administered 2023-07-28: 4 mg via INTRAVENOUS
  Filled 2023-07-28: qty 1

## 2023-07-28 MED ORDER — IOHEXOL 300 MG/ML  SOLN
100.0000 mL | Freq: Once | INTRAMUSCULAR | Status: AC | PRN
  Administered 2023-07-28: 100 mL via INTRAVENOUS

## 2023-07-28 MED ORDER — DICYCLOMINE HCL 20 MG PO TABS: 20.0000 mg | ORAL_TABLET | Freq: Four times a day (QID) | ORAL | 0 refills | Status: DC

## 2023-07-28 NOTE — ED Provider Notes (Signed)
 [facility] Provider Note    Event Date/Time   First MD Initiated Contact with Patient 07/28/23 0145     (approximate)   History   Abdominal Pain   HPI  Justin Donaldson is a 48 y.o. male who presents to the ED from home with a chief complaint of generalized abdominal pain, nausea/vomiting/diarrhea x 3 to 4 days.  Denies associated fever/chills, chest pain, shortness of breath, dysuria.  Denies recent travel.  Denies EtOH use.     Past Medical History   Past Medical History:  Diagnosis Date   Seizures (HCC)    Traumatic brain injury [facility])      Active Problem List   Patient Active Problem List   Diagnosis Date Noted   Chest pain 12/05/2016     Past Surgical History  History reviewed. No pertinent surgical history.   Home Medications   Prior to Admission medications   Medication Sig Start Date End Date Taking? Authorizing Provider  colchicine 0.6 MG tablet Take 1 tablet (0.6 mg total) by mouth 2 (two) times daily. 12/07/16 02/06/17  Adrian Saran, MD  divalproex (DEPAKOTE) 500 MG DR tablet Take 500 mg by mouth 2 (two) times daily.    [provider]  HYDROcodone-acetaminophen (NORCO/VICODIN) 5-325 MG tablet Take 1 tablet by mouth every 6 (six) hours as needed for severe pain. 06/20/17   Jene Every, MD  hydrOXYzine (ATARAX/VISTARIL) 50 MG tablet Take 50 mg by mouth every 6 (six) hours as needed. 08/06/16   [provider]  naproxen (NAPROSYN) 500 MG tablet Take 1 tablet (500 mg total) by mouth 2 (two) times daily with a meal. 06/20/17   Jene Every, MD  nicotine (NICODERM CQ - DOSED IN MG/24 HOURS) 21 mg/24hr patch Place 1 patch (21 mg total) onto the skin daily. Patient not taking: Reported on 04/04/2017 12/07/16   Adrian Saran, MD  ondansetron (ZOFRAN-ODT) 4 MG disintegrating tablet Take 1 tablet (4 mg total) by mouth every 8 (eight) hours as needed for nausea or vomiting. 01/18/22   Shaune Pollack, MD  pantoprazole  (PROTONIX) 40 MG tablet Take 1 tablet (40 mg total) by mouth daily for 14 days. 01/18/22 02/01/22  Shaune Pollack, MD  propranolol (INDERAL) 10 MG tablet Take 10 mg by mouth 3 (three) times daily.    [provider]  ranitidine (ZANTAC) 150 MG tablet Take 1 tablet (150 mg total) by mouth 2 (two) times daily. Patient not taking: Reported on 04/04/2017 12/07/16 12/07/17  Adrian Saran, MD  sertraline (ZOLOFT) 100 MG tablet Take 200 mg by mouth daily. 10/23/16 10/23/17  [provider]  sucralfate (CARAFATE) 1 g tablet Take 1 tablet (1 g total) by mouth 4 (four) times daily for 7 days. 01/18/22 01/25/22  Shaune Pollack, MD  tamsulosin (FLOMAX) 0.4 MG CAPS capsule Take 1 capsule (0.4 mg total) by mouth daily. 06/20/17   Jene Every, MD     Allergies  Patient has no known allergies.   Family History   Family History  Problem Relation Age of Onset   CAD Father      Physical Exam  Triage Vital Signs: ED Triage Vitals  Encounter Vitals Group     BP 07/27/23 1919 (!) 165/98     Systolic BP Percentile --      Diastolic BP Percentile --      Pulse Rate 07/27/23 1919 68     Resp 07/27/23 1919 18     Temp 07/27/23 1919 98.1 F (36.7 C)  Temp Source 07/27/23 1919 Oral     SpO2 07/27/23 1919 100 %     Weight 07/27/23 1918 150 lb (68 kg)     Height 07/27/23 1918 5\' 9"  (1.753 m)     Head Circumference --      Peak Flow --      Pain Score 07/27/23 1918 8     Pain Loc --      Pain Education --      Exclude from Growth Chart --     Updated Vital Signs: BP 121/86   Pulse (!) 59   Temp 98.1 F (36.7 C) (Oral)   Resp 12   Ht 5\' 9"  (1.753 m)   Wt 68 kg   SpO2 95%   BMI 22.15 kg/m    General: Awake, mild distress.  CV:  RRR.  Good peripheral perfusion.  Resp:  Normal effort.  CTAB. Abd:  Mild diffuse tenderness to palpation without rebound or guarding.  Mild distention.  Other:  No truncal vesicles.   ED Results / Procedures / Treatments  Labs (all labs ordered  are listed, but only abnormal results are displayed) Labs Reviewed  COMPREHENSIVE METABOLIC PANEL WITH GFR - Abnormal; Notable for the following components:      Result Value   Chloride 97 (*)    Calcium 8.4 (*)    Total Protein 6.4 (*)    Albumin 3.1 (*)    All other components within normal limits  URINALYSIS, ROUTINE W REFLEX MICROSCOPIC - Abnormal; Notable for the following components:   Color, Urine YELLOW (*)    APPearance CLEAR (*)    Protein, ur 30 (*)    Bacteria, UA RARE (*)    All other components within normal limits  LIPASE, BLOOD  CBC     EKG  None   RADIOLOGY I have independently visualized and interpreted patient's imaging study as well as noted the radiology interpretation:  CT abdomen/pelvis: No acute intra-abdominal process  Official radiology report(s): CT ABDOMEN PELVIS W CONTRAST Result Date: 07/28/2023 CLINICAL DATA:  Generalized abdominal pain with nausea, vomiting and diarrhea x4 days. EXAM: CT ABDOMEN AND PELVIS WITH CONTRAST TECHNIQUE: Multidetector CT imaging of the abdomen and pelvis was performed using the standard protocol following bolus administration of intravenous contrast. RADIATION DOSE REDUCTION: This exam was performed according to the departmental dose-optimization program which includes automated exposure control, adjustment of the mA and/or kV according to patient size and/or use of iterative reconstruction technique. CONTRAST:  OMNIPAQUE IOHEXOL 300 MG/ML  SOLN COMPARISON:  January 18, 2022 FINDINGS: Lower chest: No acute abnormality. Hepatobiliary: A stable 1.8 cm low-attenuation liver lesion is seen within the anterior aspect of the right lobe of the liver. No gallstones, gallbladder wall thickening, or biliary dilatation. Pancreas: Unremarkable. No pancreatic ductal dilatation or surrounding inflammatory changes. Spleen: Normal in size without focal abnormality. Adrenals/Urinary Tract: Adrenal glands are unremarkable. Kidneys are  normal in size without obstructing renal calculi or hydronephrosis. Subcentimeter simple cysts are seen within the left kidney. A punctate nonobstructing renal calculus is seen within the upper pole of the left kidney. Bladder is unremarkable. Stomach/Bowel: Stomach is within normal limits. Appendix appears normal. No evidence of bowel wall thickening, distention, or inflammatory changes. Vascular/Lymphatic: Aortic atherosclerosis. No enlarged abdominal or pelvic lymph nodes. Reproductive: Prostate is unremarkable. Other: There are small, stable bilateral fat containing inguinal hernias. No abdominopelvic ascites. Musculoskeletal: No acute or significant osseous findings. IMPRESSION: 1. No acute or active process within the abdomen or  pelvis. 2. Stable 1.8 cm low-attenuation liver lesion, likely representing a benign hemangioma. 3. Punctate nonobstructing left renal calculus. 4. Small, stable bilateral fat containing inguinal hernias. 5. Aortic atherosclerosis. Aortic Atherosclerosis (ICD10-I70.0). Electronically Signed   By: Aram Candela M.D.   On: 07/28/2023 04:02     PROCEDURES:  Critical Care performed: No  .1-3 Lead EKG Interpretation  Performed by: Irean Hong, MD Authorized by: Irean Hong, MD     Interpretation: normal     ECG rate:  68   ECG rate assessment: normal     Rhythm: sinus rhythm     Ectopy: none     Conduction: normal   Comments:     Patient placed on cardiac monitor to evaluate for arrhythmias    MEDICATIONS ORDERED IN ED: Medications  ondansetron (ZOFRAN) injection 4 mg (4 mg Intravenous Given 07/28/23 0223)  morphine (PF) 4 MG/ML injection 4 mg (4 mg Intravenous Given 07/28/23 0223)  iohexol (OMNIPAQUE) 300 MG/ML solution 100 mL (100 mLs Intravenous Contrast Given 07/28/23 0241)     IMPRESSION / MDM / ASSESSMENT AND PLAN / ED COURSE  I reviewed the triage vital signs and the nursing notes.                             48 year old male presenting with  abdominal pain, nausea/vomiting/diarrhea. Differential diagnosis includes, but is not limited to, acute appendicitis, renal colic, testicular torsion, urinary tract infection/pyelonephritis, prostatitis,  epididymitis, diverticulitis, small bowel obstruction or ileus, colitis, abdominal aortic aneurysm, gastroenteritis, hernia, etc. I personally reviewed patient's records and note a PCP office visit on 12/03/2022 for seizure, GAD.  Patient's presentation is most consistent with acute complicated illness / injury requiring diagnostic workup.  The patient is on the cardiac monitor to evaluate for evidence of arrhythmia and/or significant heart rate changes.  Laboratory results unremarkable.  Will administer IV morphine for pain, IV Zofran to prevent nausea.  Obtain CT abdomen/pelvis to evaluate etiology of patient's symptoms.  Will reassess.  Clinical Course as of 07/28/23 0534  Tue Jul 28, 2023  0533 Patient feeling better.  Updated him on unremarkable CT abdomen/pelvis.  Will discharge home with as needed Bentyl/Zofran and patient will follow-up with his PCP.  Strict return precautions given.  Patient verbalizes understanding and agrees with plan of care. [JS]    Clinical Course User Index [JS] Irean Hong, MD     FINAL CLINICAL IMPRESSION(S) / ED DIAGNOSES   Final diagnoses:  Generalized abdominal pain  Nausea vomiting and diarrhea     Rx / DC Orders   ED Discharge Orders     None        Note:  This document was prepared using Dragon voice recognition software and may include unintentional dictation errors.   Irean Hong, MD 07/28/23 [phone]

## 2023-07-28 NOTE — Discharge Instructions (Signed)
 You may take medicines as needed for abdominal discomfort and nausea/vomiting.  Clear liquid diet x 12 hours, then BRAT diet x 3 days, then slowly advance diet as tolerated.  Return to the ER for worsening symptoms, persistent vomiting, fever, difficulty breathing or other concerns.

## 2023-08-03 ENCOUNTER — Other Ambulatory Visit: Payer: Self-pay

## 2023-08-03 DIAGNOSIS — N433 Hydrocele, unspecified: Secondary | ICD-10-CM | POA: Insufficient documentation

## 2023-08-03 DIAGNOSIS — R748 Abnormal levels of other serum enzymes: Secondary | ICD-10-CM | POA: Insufficient documentation

## 2023-08-03 DIAGNOSIS — R1010 Upper abdominal pain, unspecified: Secondary | ICD-10-CM | POA: Diagnosis present

## 2023-08-03 LAB — COMPREHENSIVE METABOLIC PANEL WITH GFR
ALT: 25 U/L (ref 0–44)
AST: 23 U/L (ref 15–41)
Albumin: 2.9 g/dL — ABNORMAL LOW (ref 3.5–5.0)
Alkaline Phosphatase: 54 U/L (ref 38–126)
Anion gap: 10 (ref 5–15)
BUN: 9 mg/dL (ref 6–20)
CO2: 26 mmol/L (ref 22–32)
Calcium: 8.2 mg/dL — ABNORMAL LOW (ref 8.9–10.3)
Chloride: 97 mmol/L — ABNORMAL LOW (ref 98–111)
Creatinine, Ser: 0.88 mg/dL (ref 0.61–1.24)
GFR, Estimated: >60 mL/min (ref >60)
Glucose, Bld: 94 mg/dL (ref 70–99)
Potassium: 4.1 mmol/L (ref 3.5–5.1)
Sodium: 133 mmol/L — ABNORMAL LOW (ref 135–145)
Total Bilirubin: 0.4 mg/dL (ref 0.0–1.2)
Total Protein: 5.9 g/dL — ABNORMAL LOW (ref 6.5–8.1)

## 2023-08-03 LAB — CBC
HCT: 38.6 % — ABNORMAL LOW (ref 39.0–52.0)
Hemoglobin: 13.3 g/dL (ref 13.0–17.0)
MCH: 30.8 pg (ref 26.0–34.0)
MCHC: 34.5 g/dL (ref 30.0–36.0)
MCV: 89.4 fL (ref 80.0–100.0)
Platelets: 283 10*3/uL (ref 150–400)
RBC: 4.32 MIL/uL (ref 4.22–5.81)
RDW: 13.8 % (ref 11.5–15.5)
WBC: 9.3 10*3/uL (ref 4.0–10.5)
nRBC: 0 % (ref 0.0–0.2)

## 2023-08-03 LAB — LIPASE, BLOOD: Lipase: 56 U/L — ABNORMAL HIGH (ref 11–51)

## 2023-08-03 NOTE — ED Triage Notes (Signed)
 Pt c/o R sided abd pain x 1.5 weeks. Pt  reports that he has been unable to keep anything down.

## 2023-08-04 ENCOUNTER — Emergency Department
Admission: EM | Admit: 2023-08-04 | Discharge: 2023-08-04 | Disposition: A | Discharge status: Home / Self Care | Payer: Self-pay | Attending: Emergency Medicine | Admitting: Emergency Medicine

## 2023-08-04 ENCOUNTER — Emergency Department: Payer: Self-pay

## 2023-08-04 DIAGNOSIS — N433 Hydrocele, unspecified: Secondary | ICD-10-CM

## 2023-08-04 DIAGNOSIS — R1084 Generalized abdominal pain: Secondary | ICD-10-CM

## 2023-08-04 LAB — URINALYSIS, W/ REFLEX TO CULTURE (INFECTION SUSPECTED)
Bacteria, UA: NONE SEEN
Bilirubin Urine: NEGATIVE
Glucose, UA: NEGATIVE mg/dL
Hgb urine dipstick: NEGATIVE
Ketones, ur: NEGATIVE mg/dL
Leukocytes,Ua: NEGATIVE
Nitrite: NEGATIVE
Protein, ur: 100 mg/dL — AB
Specific Gravity, Urine: 1.035 — ABNORMAL HIGH (ref 1.005–1.030)
pH: 6 (ref 5.0–8.0)

## 2023-08-04 LAB — TROPONIN I (HIGH SENSITIVITY): Troponin I (High Sensitivity): 7 ng/L (ref <18)

## 2023-08-04 LAB — LACTIC ACID, PLASMA: Lactic Acid, Venous: 1.4 mmol/L (ref 0.5–1.9)

## 2023-08-04 MED ORDER — ONDANSETRON 4 MG PO TBDP
4.0000 mg | ORAL_TABLET | Freq: Once | ORAL | Status: AC
  Administered 2023-08-04: 4 mg via ORAL
  Filled 2023-08-04: qty 1

## 2023-08-04 MED ORDER — ALUM & MAG HYDROXIDE-SIMETH 200-200-20 MG/5ML PO SUSP
30.0000 mL | Freq: Once | ORAL | Status: AC
  Administered 2023-08-04: 30 mL via ORAL
  Filled 2023-08-04: qty 30

## 2023-08-04 MED ORDER — IBUPROFEN 600 MG PO TABS
600.0000 mg | ORAL_TABLET | Freq: Once | ORAL | Status: AC
  Administered 2023-08-04: 600 mg via ORAL
  Filled 2023-08-04: qty 1

## 2023-08-04 MED ORDER — PANTOPRAZOLE SODIUM 40 MG PO TBEC: 40.0000 mg | DELAYED_RELEASE_TABLET | Freq: Every day | ORAL | 0 refills | Status: DC

## 2023-08-04 MED ORDER — OXYCODONE HCL 5 MG PO TABS
5.0000 mg | ORAL_TABLET | Freq: Once | ORAL | Status: AC
  Administered 2023-08-04: 5 mg via ORAL
  Filled 2023-08-04: qty 1

## 2023-08-04 MED ORDER — LIDOCAINE VISCOUS HCL 2 % MT SOLN
15.0000 mL | Freq: Once | OROMUCOSAL | Status: AC
  Administered 2023-08-04: 15 mL via ORAL
  Filled 2023-08-04: qty 15

## 2023-08-04 MED ORDER — ACETAMINOPHEN 500 MG PO TABS
1000.0000 mg | ORAL_TABLET | Freq: Once | ORAL | Status: AC
  Administered 2023-08-04: 1000 mg via ORAL
  Filled 2023-08-04: qty 2

## 2023-08-04 NOTE — ED Provider Notes (Addendum)
 [facility] Provider Note    Event Date/Time   First MD Initiated Contact with Patient 08/04/23 [phone]     (approximate)   History   Abdominal Pain   HPI  Justin Donaldson is a 48 y.o. male   Past medical history of TBI and seizures who presents to the Emergency Department with ongoing upper abdominal pain for the last greater than 1 week.  He has been seen in 2 emergency departments including hours and another outside hospital and has received a CT scan of the abdomen pelvis with no remarkable findings, lab testing unremarkable but his symptoms persist.  He describes mid upper abdominal pain radiating diffusely.  No GU symptoms.  Bowel movements normal.  No bleeding.  Today he developed some right sided testicular pain as well.  There is no swelling or skin changes but it is tender to palpation.  He has no penile discharge and is not sexually active.  He does not drink much alcohol only a few on the weekends.   External Medical Documents Reviewed: Outside hospital records with a visit within the last week for abdominal pain with negative workup and ultimately discharged      Physical Exam   Triage Vital Signs: ED Triage Vitals  Encounter Vitals Group     BP 08/03/23 2238 (!) 146/103     Systolic BP Percentile --      Diastolic BP Percentile --      Pulse Rate 08/03/23 2238 79     Resp 08/04/23 0200 18     Temp 08/03/23 2238 98.8 F (37.1 C)     Temp Source 08/03/23 2238 Oral     SpO2 08/03/23 2238 98 %     Weight 08/03/23 2238 150 lb (68 kg)     Height 08/03/23 2238 5\' 9"  (1.753 m)     Head Circumference --      Peak Flow --      Pain Score 08/03/23 2238 8     Pain Loc --      Pain Education --      Exclude from Growth Chart --     Most recent vital signs: Vitals:   08/04/23 0422 08/04/23 0508  BP:  (!) 143/91  Pulse:  71  Resp:  20  Temp: 98.1 F (36.7 C)   SpO2:  100%    General: Awake, no distress.  CV:  Good peripheral  perfusion.  Resp:  Normal effort.  Abd:  No distention.  Other:  Disheveled appearing.  Slightly hypertensive otherwise vital signs normal.  He has some mild tenderness to palpation in the upper quadrant epigastrium and right sided, no rigidity or guarding.  Appears euvolemic overall nontoxic.  No skin changes or swelling noted to the testicle although when I palpate the right side he does state that it is tender.   ED Results / Procedures / Treatments   Labs (all labs ordered are listed, but only abnormal results are displayed) Labs Reviewed  LIPASE, BLOOD - Abnormal; Notable for the following components:      Result Value   Lipase 56 (*)    All other components within normal limits  COMPREHENSIVE METABOLIC PANEL WITH GFR - Abnormal; Notable for the following components:   Sodium 133 (*)    Chloride 97 (*)    Calcium 8.2 (*)    Total Protein 5.9 (*)    Albumin 2.9 (*)    All other components within normal limits  CBC -  Abnormal; Notable for the following components:   HCT 38.6 (*)    All other components within normal limits  LACTIC ACID, PLASMA  URINALYSIS, W/ REFLEX TO CULTURE (INFECTION SUSPECTED)  TROPONIN I (HIGH SENSITIVITY)     I ordered and reviewed the above labs they are notable for lipase is mildly elevated 56 otherwise cell counts electrolytes unremarkable.  EKG  ED ECG REPORT I, Pilar Jarvis, the attending physician, personally viewed and interpreted this ECG.   Date: 08/04/2023  EKG Time: 0303  Rate: 57  Rhythm: sinus  Axis: nl  Intervals:nl  ST&T Change: no stemi    RADIOLOGY I independently reviewed and interpreted ultrasound of the abdomen and see no evidence of cholecystitis I also reviewed radiologist's formal read.   PROCEDURES:  Critical Care performed: No  Procedures   MEDICATIONS ORDERED IN ED: Medications  acetaminophen (TYLENOL) tablet 1,000 mg (1,000 mg Oral Given 08/04/23 0251)  ibuprofen (ADVIL) tablet 600 mg (600 mg Oral Given  08/04/23 0251)  oxyCODONE (Oxy IR/ROXICODONE) immediate release tablet 5 mg (5 mg Oral Given 08/04/23 0251)  ondansetron (ZOFRAN-ODT) disintegrating tablet 4 mg (4 mg Oral Given 08/04/23 0251)  alum & mag hydroxide-simeth (MAALOX/MYLANTA) 200-200-20 MG/5ML suspension 30 mL (30 mLs Oral Given 08/04/23 0251)    And  lidocaine (XYLOCAINE) 2 % viscous mouth solution 15 mL (15 mLs Oral Given 08/04/23 0251)     IMPRESSION / MDM / ASSESSMENT AND PLAN / ED COURSE  I reviewed the triage vital signs and the nursing notes.                                Patient's presentation is most consistent with acute presentation with potential threat to life or bodily function.  Differential diagnosis includes, but is not limited to, gastritis/ulcer, biliary pathologies like gallstones, cholecystitis, pancreatitis, considered but less likely mesenteric ischemia, appendicitis, diverticulitis, considered testicular torsion or testicular infection, cancer, constipation, UTI   The patient is on the cardiac monitor to evaluate for evidence of arrhythmia and/or significant heart rate changes.  MDM:    I am not sure what is causing the patient's ongoing symptoms but he did have extensive workup very recently including CT abdomen pelvis with no remarkable findings.  No frank chest pain and doubt ACS will check a troponin and EKG.  There is mild tenderness and we can recheck the gallbladder today in case there is any progression of symptoms that can be found on ultrasound, ultrasound of the testicle as well, and medicate with antiemetics and GI cocktail.  I considered mesenteric ischemia but I think less likely given no CT findings suggestive the same, no pain out of proportion, but can check a lactic and if not markedly elevated I think this diagnosis would be extremely unlikely.  Ultimately if unremarkable workup today, patient is stable for outpatient follow-up and I will refer him to PMD and GI.  -- Patient stable  workup unremarkable. He started smoking in the room and so our nurse told him he could not do that so he got frustrated and left. He did not stay for the results of his urinalysis which ended up looking noninfected.       FINAL CLINICAL IMPRESSION(S) / ED DIAGNOSES   Final diagnoses:  Generalized abdominal pain  Hydrocele in adult     Rx / DC Orders   ED Discharge Orders          Ordered  Ambulatory Referral to Primary Care (Establish Care)        08/04/23 0524    Ambulatory referral to Gastroenterology        08/04/23 0524    pantoprazole (PROTONIX) 40 MG tablet  Daily        08/04/23 0524             Note:  This document was prepared using Dragon voice recognition software and may include unintentional dictation errors.    Buell Carmin, MD 08/04/23 [id number]    Buell Carmin, MD 08/04/23 [phone]

## 2023-08-04 NOTE — ED Notes (Signed)
 Pt complaining of 10/10 abdominal pain and advised he has been seen here as well as [facility] and has not been diagnosed with anything. The pt believes that it may be his gallbladder because he researched his symptoms online and they all align with what he is feeling. Pt also advised that he can drink a gallon of water and when he goes to try to void, nothing to very little comes out. Pt denies painful or burning urination.

## 2023-08-04 NOTE — ED Notes (Signed)
 Pt smoking in room per Zoyie RN. Spoke to pt regarding smoking cigarettes in the room, and pt stated "it was just one drag". Offered pt to move to hallway bed to wait for paperwork and pt refused to wait for paperwork and ambulated independently out of department. Declined to wait for d/c instruction.

## 2023-08-04 NOTE — Discharge Instructions (Addendum)
 Fortunately your evaluation in the emergency department did not show any emergency conditions that accounted for your symptoms that would require an emergency surgery or hospitalization at this time.  I refilled your pantoprazole which is a stomach acid reducer which may help with your symptoms.  In the meantime I have also referred you to a gastroenterologist who will call you to schedule appointment for further evaluation of your abdominal pain.  I also made a referral to a general practitioner who will call you to establish care as your primary doctor.  Your testicle exam did not show any emergency conditions but does show a hydrocele for which I attached some information.  If this continues to bother you you can call urologist Dr. Cherylene Corrente for a follow-up appointment.  I have attached their number for your information.  Thank you for choosing us  for your health care today!  Please see your primary doctor this week for a follow up appointment.   If you have any new, worsening, or unexpected symptoms call your doctor right away or come back to the emergency department for reevaluation.  It was my pleasure to care for you today.   Arron Large Margery Sheets, MD

## 2023-08-28 ENCOUNTER — Emergency Department
Admission: EM | Admit: 2023-08-28 | Discharge: 2023-08-28 | Discharge status: Against Medical Advice | Payer: Self-pay | Attending: Emergency Medicine | Admitting: Emergency Medicine

## 2023-08-28 ENCOUNTER — Other Ambulatory Visit: Payer: Self-pay

## 2023-08-28 DIAGNOSIS — R1010 Upper abdominal pain, unspecified: Secondary | ICD-10-CM | POA: Insufficient documentation

## 2023-08-28 DIAGNOSIS — R079 Chest pain, unspecified: Secondary | ICD-10-CM | POA: Insufficient documentation

## 2023-08-28 DIAGNOSIS — Z5321 Procedure and treatment not carried out due to patient leaving prior to being seen by health care provider: Secondary | ICD-10-CM | POA: Insufficient documentation

## 2023-08-28 DIAGNOSIS — R1110 Vomiting, unspecified: Secondary | ICD-10-CM | POA: Diagnosis not present

## 2023-08-28 LAB — COMPREHENSIVE METABOLIC PANEL WITH GFR
ALT: 78 U/L — ABNORMAL HIGH (ref 0–44)
AST: 27 U/L (ref 15–41)
Albumin: 3.3 g/dL — ABNORMAL LOW (ref 3.5–5.0)
Alkaline Phosphatase: 68 U/L (ref 38–126)
Anion gap: 13 (ref 5–15)
BUN: 14 mg/dL (ref 6–20)
CO2: 26 mmol/L (ref 22–32)
Calcium: 8.8 mg/dL — ABNORMAL LOW (ref 8.9–10.3)
Chloride: 92 mmol/L — ABNORMAL LOW (ref 98–111)
Creatinine, Ser: 0.93 mg/dL (ref 0.61–1.24)
GFR, Estimated: >60 mL/min (ref >60)
Glucose, Bld: 105 mg/dL — ABNORMAL HIGH (ref 70–99)
Potassium: 3.4 mmol/L — ABNORMAL LOW (ref 3.5–5.1)
Sodium: 131 mmol/L — ABNORMAL LOW (ref 135–145)
Total Bilirubin: 0.4 mg/dL (ref 0.0–1.2)
Total Protein: 6.3 g/dL — ABNORMAL LOW (ref 6.5–8.1)

## 2023-08-28 LAB — URINALYSIS, ROUTINE W REFLEX MICROSCOPIC
Bacteria, UA: NONE SEEN
Bilirubin Urine: NEGATIVE
Glucose, UA: NEGATIVE mg/dL
Hgb urine dipstick: NEGATIVE
Ketones, ur: 5 mg/dL — AB
Leukocytes,Ua: NEGATIVE
Nitrite: NEGATIVE
Protein, ur: 30 mg/dL — AB
Specific Gravity, Urine: 1.025 (ref 1.005–1.030)
pH: 7 (ref 5.0–8.0)

## 2023-08-28 LAB — CBC
HCT: 39.4 % (ref 39.0–52.0)
Hemoglobin: 13.6 g/dL (ref 13.0–17.0)
MCH: 29.6 pg (ref 26.0–34.0)
MCHC: 34.5 g/dL (ref 30.0–36.0)
MCV: 85.8 fL (ref 80.0–100.0)
Platelets: 278 10*3/uL (ref 150–400)
RBC: 4.59 MIL/uL (ref 4.22–5.81)
RDW: 13.5 % (ref 11.5–15.5)
WBC: 10.5 10*3/uL (ref 4.0–10.5)
nRBC: 0 % (ref 0.0–0.2)

## 2023-08-28 LAB — LIPASE, BLOOD: Lipase: 34 U/L (ref 11–51)

## 2023-08-28 LAB — TROPONIN I (HIGH SENSITIVITY): Troponin I (High Sensitivity): 7 ng/L (ref <18)

## 2023-08-28 MED ORDER — ONDANSETRON 4 MG PO TBDP
4.0000 mg | ORAL_TABLET | Freq: Once | ORAL | Status: AC
  Administered 2023-08-28: 4 mg via ORAL
  Filled 2023-08-28: qty 1

## 2023-08-28 NOTE — ED Triage Notes (Addendum)
 Patient C/O upper-middle abdominal pain/pain right below the breast bone and vomiting that began yesterday and has worsened today. Patient denies any cardiac or GI history at this time.

## 2023-08-28 NOTE — ED Notes (Signed)
 No answer when called several times from lobby

## 2023-08-28 NOTE — ED Notes (Signed)
 Sent blue top if needed

## 2023-08-28 NOTE — ED Triage Notes (Signed)
 EMS brings pt in from home for c/o CP since yesterday

## 2024-04-18 ENCOUNTER — Emergency Department

## 2024-04-18 ENCOUNTER — Other Ambulatory Visit: Payer: Self-pay

## 2024-04-18 ENCOUNTER — Observation Stay
Admission: EM | Admit: 2024-04-18 | Discharge: 2024-04-20 | Disposition: A | Attending: Emergency Medicine | Admitting: Emergency Medicine

## 2024-04-18 DIAGNOSIS — K922 Gastrointestinal hemorrhage, unspecified: Secondary | ICD-10-CM

## 2024-04-18 DIAGNOSIS — F1090 Alcohol use, unspecified, uncomplicated: Secondary | ICD-10-CM | POA: Diagnosis not present

## 2024-04-18 DIAGNOSIS — K273 Acute peptic ulcer, site unspecified, without hemorrhage or perforation: Secondary | ICD-10-CM | POA: Insufficient documentation

## 2024-04-18 DIAGNOSIS — K279 Peptic ulcer, site unspecified, unspecified as acute or chronic, without hemorrhage or perforation: Secondary | ICD-10-CM

## 2024-04-18 DIAGNOSIS — F1490 Cocaine use, unspecified, uncomplicated: Secondary | ICD-10-CM | POA: Diagnosis not present

## 2024-04-18 DIAGNOSIS — R109 Unspecified abdominal pain: Secondary | ICD-10-CM | POA: Diagnosis present

## 2024-04-18 DIAGNOSIS — F17210 Nicotine dependence, cigarettes, uncomplicated: Secondary | ICD-10-CM | POA: Diagnosis not present

## 2024-04-18 DIAGNOSIS — Z79899 Other long term (current) drug therapy: Secondary | ICD-10-CM | POA: Diagnosis not present

## 2024-04-18 DIAGNOSIS — R1013 Epigastric pain: Principal | ICD-10-CM

## 2024-04-18 DIAGNOSIS — G40909 Epilepsy, unspecified, not intractable, without status epilepticus: Secondary | ICD-10-CM | POA: Insufficient documentation

## 2024-04-18 DIAGNOSIS — Z8711 Personal history of peptic ulcer disease: Secondary | ICD-10-CM

## 2024-04-18 LAB — COMPREHENSIVE METABOLIC PANEL WITH GFR
ALT: 22 U/L (ref 0–44)
AST: 24 U/L (ref 15–41)
Albumin: 4.4 g/dL (ref 3.5–5.0)
Alkaline Phosphatase: 91 U/L (ref 38–126)
Anion gap: 14 (ref 5–15)
BUN: 14 mg/dL (ref 6–20)
CO2: 25 mmol/L (ref 22–32)
Calcium: 10.8 mg/dL — ABNORMAL HIGH (ref 8.9–10.3)
Chloride: 101 mmol/L (ref 98–111)
Creatinine, Ser: 0.74 mg/dL (ref 0.61–1.24)
GFR, Estimated: >60 mL/min
Glucose, Bld: 90 mg/dL (ref 70–99)
Potassium: 4.4 mmol/L (ref 3.5–5.1)
Sodium: 139 mmol/L (ref 135–145)
Total Bilirubin: 0.4 mg/dL (ref 0.0–1.2)
Total Protein: 8 g/dL (ref 6.5–8.1)

## 2024-04-18 LAB — URINALYSIS, ROUTINE W REFLEX MICROSCOPIC
Bilirubin Urine: NEGATIVE
Glucose, UA: NEGATIVE mg/dL
Ketones, ur: 5 mg/dL — AB
Leukocytes,Ua: NEGATIVE
Nitrite: NEGATIVE
Protein, ur: NEGATIVE mg/dL
Specific Gravity, Urine: 1.03 (ref 1.005–1.030)
pH: 5 (ref 5.0–8.0)

## 2024-04-18 LAB — CBC
HCT: 47.3 % (ref 39.0–52.0)
Hemoglobin: 15.3 g/dL (ref 13.0–17.0)
MCH: 29.4 pg (ref 26.0–34.0)
MCHC: 32.3 g/dL (ref 30.0–36.0)
MCV: 91 fL (ref 80.0–100.0)
Platelets: 270 K/uL (ref 150–400)
RBC: 5.2 MIL/uL (ref 4.22–5.81)
RDW: 13.9 % (ref 11.5–15.5)
WBC: 8.7 K/uL (ref 4.0–10.5)
nRBC: 0 % (ref 0.0–0.2)

## 2024-04-18 LAB — LIPASE, BLOOD: Lipase: 18 U/L (ref 11–51)

## 2024-04-18 MED ORDER — ONDANSETRON HCL 4 MG/2ML IJ SOLN: 4.0000 mg | Freq: Four times a day (QID) | INTRAMUSCULAR | Status: DC | PRN

## 2024-04-18 MED ORDER — HYDROMORPHONE HCL 1 MG/ML IJ SOLN
0.5000 mg | Freq: Once | INTRAMUSCULAR | Status: AC
  Administered 2024-04-18: 0.5 mg via INTRAVENOUS
  Filled 2024-04-18: qty 0.5

## 2024-04-18 MED ORDER — ONDANSETRON HCL 4 MG/2ML IJ SOLN
4.0000 mg | Freq: Once | INTRAMUSCULAR | Status: AC
  Administered 2024-04-18: 4 mg via INTRAVENOUS
  Filled 2024-04-18: qty 2

## 2024-04-18 MED ORDER — IOHEXOL 300 MG/ML  SOLN
100.0000 mL | Freq: Once | INTRAMUSCULAR | Status: AC | PRN
  Administered 2024-04-18: 100 mL via INTRAVENOUS

## 2024-04-18 MED ORDER — MORPHINE SULFATE (PF) 4 MG/ML IV SOLN
4.0000 mg | Freq: Once | INTRAVENOUS | Status: AC
  Administered 2024-04-18: 4 mg via INTRAVENOUS
  Filled 2024-04-18: qty 1

## 2024-04-18 MED ORDER — PANTOPRAZOLE SODIUM 40 MG IV SOLR
40.0000 mg | Freq: Once | INTRAVENOUS | Status: AC
  Administered 2024-04-18: 40 mg via INTRAVENOUS
  Filled 2024-04-18: qty 10

## 2024-04-18 MED ORDER — LACTATED RINGERS IV SOLN: INTRAVENOUS | Status: DC

## 2024-04-18 MED ORDER — ACETAMINOPHEN 650 MG RE SUPP: 650.0000 mg | Freq: Four times a day (QID) | RECTAL | Status: DC | PRN

## 2024-04-18 MED ORDER — ONDANSETRON HCL 4 MG PO TABS: 4.0000 mg | ORAL_TABLET | Freq: Four times a day (QID) | ORAL | Status: DC | PRN

## 2024-04-18 MED ORDER — ACETAMINOPHEN 325 MG PO TABS: 650.0000 mg | ORAL_TABLET | Freq: Four times a day (QID) | ORAL | Status: DC | PRN

## 2024-04-18 MED ORDER — SENNOSIDES-DOCUSATE SODIUM 8.6-50 MG PO TABS: 1.0000 | ORAL_TABLET | Freq: Every evening | ORAL | Status: DC | PRN

## 2024-04-18 MED ORDER — HYDROMORPHONE HCL 1 MG/ML IJ SOLN
0.5000 mg | INTRAMUSCULAR | Status: DC | PRN
  Administered 2024-04-19: 0.5 mg via INTRAVENOUS
  Filled 2024-04-18: qty 1

## 2024-04-18 MED ORDER — OXYCODONE HCL 5 MG PO TABS: 5.0000 mg | ORAL_TABLET | ORAL | Status: DC | PRN

## 2024-04-18 MED ORDER — LACTATED RINGERS IV BOLUS
1000.0000 mL | Freq: Once | INTRAVENOUS | Status: AC
  Administered 2024-04-18: 1000 mL via INTRAVENOUS

## 2024-04-18 MED ORDER — SODIUM CHLORIDE 0.9% FLUSH
3.0000 mL | Freq: Two times a day (BID) | INTRAVENOUS | Status: DC
  Administered 2024-04-18 – 2024-04-20 (×4): 3 mL via INTRAVENOUS

## 2024-04-18 NOTE — ED Triage Notes (Signed)
 Pt comes with c.o belly pain, dark stools and nausea. Pt states this started 4 days ago.

## 2024-04-18 NOTE — ED Provider Notes (Signed)
 "  [facility] Provider Note    Event Date/Time   First MD Initiated Contact with Patient 04/18/24 1553     (approximate)   History   Abdominal Pain   HPI  Justin Donaldson is a 48 y.o. male who presents to the ED for evaluation of Abdominal Pain   I reviewed outpatient [facility] EGD from April.  History of duodenal ulcers.  Patient presents to the ED with 4 days of severe epigastric abdominal pain, poor toleration of p.o. intake, recurrent emesis with coffee-ground hematemesis, reports coexisting melena.  Not taking any antacid medications these days.  No surgical history   Physical Exam   Triage Vital Signs: ED Triage Vitals  Encounter Vitals Group     BP 04/18/24 1456 (!) 130/100     Girls Systolic BP Percentile --      Girls Diastolic BP Percentile --      Boys Systolic BP Percentile --      Boys Diastolic BP Percentile --      Pulse Rate 04/18/24 1456 89     Resp 04/18/24 1456 18     Temp 04/18/24 1456 98 F (36.7 C)     Temp src --      SpO2 04/18/24 1456 100 %     Weight --      Height --      Head Circumference --      Peak Flow --      Pain Score 04/18/24 1455 6     Pain Loc --      Pain Education --      Exclude from Growth Chart --     Most recent vital signs: Vitals:   04/18/24 1456 04/18/24 1924  BP: (!) 130/100 131/89  Pulse: 89 85  Resp: 18 16  Temp: 98 F (36.7 C) 98.3 F (36.8 C)  SpO2: 100% 100%    General: Awake, no distress.  CV:  Good peripheral perfusion.  Resp:  Normal effort.  Abd:  No distention.  Significant periumbilical, epigastric and right sided abdominal tenderness with voluntary guarding. MSK:  No deformity noted.  Neuro:  No focal deficits appreciated. Other:     ED Results / Procedures / Treatments   Labs (all labs ordered are listed, but only abnormal results are displayed) Labs Reviewed  COMPREHENSIVE METABOLIC PANEL WITH GFR - Abnormal; Notable for the following components:      Result  Value   Calcium  10.8 (*)    All other components within normal limits  URINALYSIS, ROUTINE W REFLEX MICROSCOPIC - Abnormal; Notable for the following components:   Color, Urine YELLOW (*)    APPearance CLEAR (*)    Hgb urine dipstick MODERATE (*)    Ketones, ur 5 (*)    Bacteria, UA RARE (*)    All other components within normal limits  LIPASE, BLOOD  CBC  HIV ANTIBODY (ROUTINE TESTING W REFLEX)  MAGNESIUM  BASIC METABOLIC PANEL WITH GFR  CBC    EKG   RADIOLOGY CT abdomen/pelvis interpreted by me with possible microperforation  Official radiology report(s): CT ABDOMEN PELVIS W CONTRAST Result Date: 04/18/2024 EXAM: CT ABDOMEN AND PELVIS WITH CONTRAST 04/18/2024 05:52:38 PM TECHNIQUE: CT of the abdomen and pelvis was performed with the administration of 100 mL of iohexol  (OMNIPAQUE ) 300 MG/ML solution. Multiplanar reformatted images are provided for review. Automated exposure control, iterative reconstruction, and/or weight-based adjustment of the mA/kV was utilized to reduce the radiation dose to as low as reasonably  achievable. COMPARISON: CT abdomen and pelvis from 12/09/2023. CLINICAL HISTORY: eval duodenal perf/ulcer. hx d1/d2 ulcer, noncompliant with meds, quite tender FINDINGS: LOWER CHEST: No acute abnormality. LIVER: Mild lesion in the inferior right lobe of the liver measures 2 cm and is favored as hemangioma, unchanged. No new liver lesions are seen. GALLBLADDER AND BILE DUCTS: Gallbladder is unremarkable. No biliary ductal dilatation. SPLEEN: No acute abnormality. PANCREAS: No acute abnormality. ADRENAL GLANDS: No acute abnormality. KIDNEYS, URETERS AND BLADDER: No stones in the kidneys or ureters. No hydronephrosis. No perinephric or periureteral stranding. Urinary bladder is unremarkable. GI AND BOWEL: Stomach demonstrates no acute abnormality. Appendix appears normal. There is wall thickening and inflammation of the proximal duodenum. There is a small diverticulum versus  microperforation along the inferior margin of the proximal duodenum best seen on sagittal image 6/49 and coronal image 5/37. No other free air identified. There is no bowel obstruction. PERITONEUM AND RETROPERITONEUM: No ascites. No free air. VASCULATURE: Aorta is normal in caliber. Atherosclerotic calcifications of the aorta. LYMPH NODES: No lymphadenopathy. REPRODUCTIVE ORGANS: No acute abnormality. BONES AND SOFT TISSUES: No acute osseous abnormality. There are small fat containing inguinal hernias. No focal soft tissue abnormality. IMPRESSION: 1. Wall thickening and inflammation of the proximal duodenum with small diverticulum versus microperforation along the inferior margin. No other free air identified. 2. Mild lesion in the inferior right lobe of the liver, favored as hemangioma, unchanged. No new liver lesions are seen. Electronically signed by: Greig Pique MD 04/18/2024 08:19 PM EST RP Workstation: [id number]    PROCEDURES and INTERVENTIONS:  .Critical Care  Performed by: Claudene Rover, MD Authorized by: Claudene Rover, MD   Critical care provider statement:    Critical care time (minutes):  30   Critical care time was exclusive of:  Separately billable procedures and treating other patients   Critical care was necessary to treat or prevent imminent or life-threatening deterioration of the following conditions:  Trauma   Critical care was time spent personally by me on the following activities:  Development of treatment plan with patient or surrogate, discussions with consultants, evaluation of patient's response to treatment, examination of patient, ordering and review of laboratory studies, ordering and review of radiographic studies, ordering and performing treatments and interventions, pulse oximetry, re-evaluation of patient's condition and review of old charts   Medications  sodium chloride  flush (NS) 0.9 % injection 3 mL (has no administration in time range)  acetaminophen  (TYLENOL )  tablet 650 mg (has no administration in time range)    Or  acetaminophen  (TYLENOL ) suppository 650 mg (has no administration in time range)  senna-docusate (Senokot-S) tablet 1 tablet (has no administration in time range)  lactated ringers  infusion (has no administration in time range)  oxyCODONE  (Oxy IR/ROXICODONE ) immediate release tablet 5 mg (has no administration in time range)  HYDROmorphone  (DILAUDID ) injection 0.5-1 mg (has no administration in time range)  ondansetron  (ZOFRAN ) tablet 4 mg (has no administration in time range)    Or  ondansetron  (ZOFRAN ) injection 4 mg (has no administration in time range)  ondansetron  (ZOFRAN ) injection 4 mg (4 mg Intravenous Given 04/18/24 1709)  pantoprazole  (PROTONIX ) injection 40 mg (40 mg Intravenous Given 04/18/24 1713)  morphine  (PF) 4 MG/ML injection 4 mg (4 mg Intravenous Given 04/18/24 1712)  lactated ringers  bolus 1,000 mL (0 mLs Intravenous Stopped 04/18/24 1924)  HYDROmorphone  (DILAUDID ) injection 0.5 mg (0.5 mg Intravenous Given 04/18/24 1740)  iohexol  (OMNIPAQUE ) 300 MG/ML solution 100 mL (100 mLs Intravenous Contrast Given 04/18/24 1746)  IMPRESSION / MDM / ASSESSMENT AND PLAN / ED COURSE  I reviewed the triage vital signs and the nursing notes.  Differential diagnosis includes, but is not limited to, duodenal perforation, symptomatic ulcer, upper GI bleeding, biliary colic, pancreatitis  {Patient presents with symptoms of an acute illness or injury that is potentially life-threatening.  Patient with known duodenal PUD presents with pain and symptoms of upper GI bleeding.  Normal vitals.  Quite tender on initial exam gives me initial concern for perforation.  Blood work is reassuring with normal WBC and hemoglobin, lipase and metabolic panel within normal limits.  Urine without infectious features.  CT, as above, questions possible microperforation.  But after this his pain is improved dramatically and no longer as tender.  I  consult with general surgery, GI and medicine for admission.  Clinical Course as of 04/18/24 2133  Mon Apr 18, 2024  2020 Reassessed, patient report significantly improved pain, on palpation his tenderness has nearly resolved and guarding has resolved [DS]  2034 Briefly discuss with Dr. Cesar.  No surgery considering uncertain if he even has a perforation and tenderness is improved dramatically [DS]  2038 Consult with Dr. Therisa. Admit for obs.  No EGD due to possible perf [DS]    Clinical Course User Index [DS] Claudene Rover, MD     FINAL CLINICAL IMPRESSION(S) / ED DIAGNOSES   Final diagnoses:  Epigastric pain  UGIB (upper gastrointestinal bleed)  PUD (peptic ulcer disease)     Rx / DC Orders   ED Discharge Orders     None        Note:  This document was prepared using Dragon voice recognition software and may include unintentional dictation errors.   Claudene Rover, MD 04/18/24 2133  "

## 2024-04-18 NOTE — ED Triage Notes (Signed)
 BIB [facility] C?O Abd pain x 4 days, black tarry stools, and vomiting blood, nausea, dizziness.  Arrives from [place] office.    169/109

## 2024-04-18 NOTE — H&P (Signed)
 " History and Physical    Justin Donaldson FMW:[id number] DOB: 1976-03-04 DOA: 04/18/2024  DOS: the patient was seen and examined on 04/18/2024  PCP: Pcp, No   Patient coming from: Home  I have personally briefly reviewed patient's old medical records in [facility] and CareEverywhere  HPI:   Justin Donaldson is a 48 y.o. year old male with medical history of TBI, seizures, PUD and tobacco use disorder presenting to the ED with abdominal pain.  Patient states that has been happening for 4 days.  He also reports significant nausea states he has had an episode of hematemesis and melena. About 5 episodes of vomiting with one episode of melena. On arrival to the ED patient was noted to be HDS stable.  Lab work and imaging performed.  CBC unremarkable with baseline hemoglobin.  CMP overall unremarkable except mild hypercalcemia.  Lipase within normal limits.  UA without signs of infection.  CT abdomen pelvis obtained shows wall thickening and inflammation of the proximal duodenum with small diverticulum versus perforation along the inferior margin with no free air.  There is mild lesion on the inferior right lobe of the liver which is favored to be hemangioma and unchanged from previous studies.  Given patient's complaint of pain and image findings, [facility] consulted for admission.   Review of Systems: As mentioned in the history of present illness. All other systems reviewed and are negative.   Past Medical History:  Diagnosis Date   Seizures (HCC)    Traumatic brain injury [facility])     History reviewed. No pertinent surgical history.   Allergies[1]  Family History  Problem Relation Age of Onset   CAD Father     Prior to Admission medications  Medication Sig Start Date End Date Taking? Authorizing Provider  colchicine  0.6 MG tablet Take 1 tablet (0.6 mg total) by mouth 2 (two) times daily. 12/07/16 02/06/17  Barbette Cea, MD  dicyclomine  (BENTYL ) 20 MG tablet Take 1 tablet (20 mg  total) by mouth every 6 (six) hours. 07/28/23   Sung, Jade J, MD  divalproex  (DEPAKOTE ) 500 MG DR tablet Take 500 mg by mouth 2 (two) times daily.    [provider]  HYDROcodone -acetaminophen  (NORCO/VICODIN) 5-325 MG tablet Take 1 tablet by mouth every 6 (six) hours as needed for severe pain. 06/20/17   Arlander Charleston, MD  hydrOXYzine  (ATARAX /VISTARIL ) 50 MG tablet Take 50 mg by mouth every 6 (six) hours as needed. 08/06/16   [provider]  naproxen  (NAPROSYN ) 500 MG tablet Take 1 tablet (500 mg total) by mouth 2 (two) times daily with a meal. 06/20/17   Arlander Charleston, MD  nicotine  (NICODERM CQ  - DOSED IN MG/24 HOURS) 21 mg/24hr patch Place 1 patch (21 mg total) onto the skin daily. Patient not taking: Reported on 04/04/2017 12/07/16   Barbette Cea, MD  ondansetron  (ZOFRAN -ODT) 4 MG disintegrating tablet Take 1 tablet (4 mg total) by mouth every 8 (eight) hours as needed for nausea or vomiting. 07/28/23   Sung, Jade J, MD  pantoprazole  (PROTONIX ) 40 MG tablet Take 1 tablet (40 mg total) by mouth daily for 14 days. 08/04/23 08/18/23  Cyrena Mylar, MD  propranolol  (INDERAL ) 10 MG tablet Take 10 mg by mouth 3 (three) times daily.    [provider]  ranitidine  (ZANTAC ) 150 MG tablet Take 1 tablet (150 mg total) by mouth 2 (two) times daily. Patient not taking: Reported on 04/04/2017 12/07/16 12/07/17  Barbette Cea, MD  sertraline  (ZOLOFT ) 100 MG tablet  Take 200 mg by mouth daily. 10/23/16 10/23/17  [provider]  sucralfate  (CARAFATE ) 1 g tablet Take 1 tablet (1 g total) by mouth 4 (four) times daily for 7 days. 01/18/22 01/25/22  Angelena Smalls, MD  tamsulosin  (FLOMAX ) 0.4 MG CAPS capsule Take 1 capsule (0.4 mg total) by mouth daily. 06/20/17   Arlander Charleston, MD    Social History:  reports that he has been smoking cigarettes. He has never used smokeless tobacco. He reports current alcohol use. He reports current drug use. Drug: Marijuana.    Physical Exam: Vitals:   04/18/24  1456 04/18/24 1924  BP: (!) 130/100 131/89  Pulse: 89 85  Resp: 18 16  Temp: 98 F (36.7 C) 98.3 F (36.8 C)  TempSrc:  Oral  SpO2: 100% 100%    Gen: NAD HENT: NCAT, poor dentition.  CV: normal heart sounds Lung: CTAB Abd: Mild TTP in epigastric area, normal bowel sounds MSK: No asymmetry, good bulk and tone Neuro: alert and oriented   Labs on Admission: I have personally reviewed following labs and imaging studies  CBC: Recent Labs  Lab 04/18/24 1457  WBC 8.7  HGB 15.3  HCT 47.3  MCV 91.0  PLT 270   Basic Metabolic Panel: Recent Labs  Lab 04/18/24 1457  NA 139  K 4.4  CL 101  CO2 25  GLUCOSE 90  BUN 14  CREATININE 0.74  CALCIUM  10.8*   GFR: CrCl cannot be calculated (Unknown ideal weight.). Liver Function Tests: Recent Labs  Lab 04/18/24 1457  AST 24  ALT 22  ALKPHOS 91  BILITOT 0.4  PROT 8.0  ALBUMIN 4.4   Recent Labs  Lab 04/18/24 1457  LIPASE 18   No results for input(s): AMMONIA in the last 168 hours. Coagulation Profile: No results for input(s): INR, PROTIME in the last 168 hours. Cardiac Enzymes: No results for input(s): CKTOTAL, CKMB, CKMBINDEX, TROPONINI, TROPONINIHS in the last 168 hours. BNP (last 3 results) No results for input(s): BNP in the last 8760 hours. HbA1C: No results for input(s): HGBA1C in the last 72 hours. CBG: No results for input(s): GLUCAP in the last 168 hours. Lipid Profile: No results for input(s): CHOL, HDL, LDLCALC, TRIG, CHOLHDL, LDLDIRECT in the last 72 hours. Thyroid  Function Tests: No results for input(s): TSH, T4TOTAL, FREET4, T3FREE, THYROIDAB in the last 72 hours. Anemia Panel: No results for input(s): VITAMINB12, FOLATE, FERRITIN, TIBC, IRON, RETICCTPCT in the last 72 hours. Urine analysis:    Component Value Date/Time   COLORURINE YELLOW (A) 04/18/2024 1649   APPEARANCEUR CLEAR (A) 04/18/2024 1649   LABSPEC 1.030 04/18/2024 1649    PHURINE 5.0 04/18/2024 1649   GLUCOSEU NEGATIVE 04/18/2024 1649   HGBUR MODERATE (A) 04/18/2024 1649   BILIRUBINUR NEGATIVE 04/18/2024 1649   KETONESUR 5 (A) 04/18/2024 1649   PROTEINUR NEGATIVE 04/18/2024 1649   NITRITE NEGATIVE 04/18/2024 1649   LEUKOCYTESUR NEGATIVE 04/18/2024 1649    Radiological Exams on Admission: I have personally reviewed images CT ABDOMEN PELVIS W CONTRAST Result Date: 04/18/2024 EXAM: CT ABDOMEN AND PELVIS WITH CONTRAST 04/18/2024 05:52:38 PM TECHNIQUE: CT of the abdomen and pelvis was performed with the administration of 100 mL of iohexol  (OMNIPAQUE ) 300 MG/ML solution. Multiplanar reformatted images are provided for review. Automated exposure control, iterative reconstruction, and/or weight-based adjustment of the mA/kV was utilized to reduce the radiation dose to as low as reasonably achievable. COMPARISON: CT abdomen and pelvis from 12/09/2023. CLINICAL HISTORY: eval duodenal perf/ulcer. hx d1/d2 ulcer, noncompliant with meds, quite  tender FINDINGS: LOWER CHEST: No acute abnormality. LIVER: Mild lesion in the inferior right lobe of the liver measures 2 cm and is favored as hemangioma, unchanged. No new liver lesions are seen. GALLBLADDER AND BILE DUCTS: Gallbladder is unremarkable. No biliary ductal dilatation. SPLEEN: No acute abnormality. PANCREAS: No acute abnormality. ADRENAL GLANDS: No acute abnormality. KIDNEYS, URETERS AND BLADDER: No stones in the kidneys or ureters. No hydronephrosis. No perinephric or periureteral stranding. Urinary bladder is unremarkable. GI AND BOWEL: Stomach demonstrates no acute abnormality. Appendix appears normal. There is wall thickening and inflammation of the proximal duodenum. There is a small diverticulum versus microperforation along the inferior margin of the proximal duodenum best seen on sagittal image 6/49 and coronal image 5/37. No other free air identified. There is no bowel obstruction. PERITONEUM AND RETROPERITONEUM: No  ascites. No free air. VASCULATURE: Aorta is normal in caliber. Atherosclerotic calcifications of the aorta. LYMPH NODES: No lymphadenopathy. REPRODUCTIVE ORGANS: No acute abnormality. BONES AND SOFT TISSUES: No acute osseous abnormality. There are small fat containing inguinal hernias. No focal soft tissue abnormality. IMPRESSION: 1. Wall thickening and inflammation of the proximal duodenum with small diverticulum versus microperforation along the inferior margin. No other free air identified. 2. Mild lesion in the inferior right lobe of the liver, favored as hemangioma, unchanged. No new liver lesions are seen. Electronically signed by: Greig Pique MD 04/18/2024 08:19 PM EST RP Workstation: [id number]    EKG: My personal interpretation of EKG shows: Pending    Assessment/Plan Principal Problem:   Abdominal pain, acute Active Problems:   Seizure disorder (HCC)   History of peptic ulcer disease   Patient with history of peptic ulcer disease presented to the ED with abdominal pain with imaging showing concern for duodenitis versus microperforation in the inferior portion of the duodenum.  On chart review patient with history of complaint for abdominal pain with previous duodenal ulcers appears to be nonedematous PPI.  It was recommended patient get EGD for better evaluation.  Patient already started on PPI so unable to get breath or stool H. pylori testing.  Will continue his PPI and given his report for hematemesis/melena monitor his H&H.  Will consult GI to see if there is any indication for EGD.  Given the concern for microperforation, we will see consult from general surgery as the finding of free air makes it this this is less likely.  Seizure disorder: Holding home medicine and continuous reevaluations patient.  He is on medicines Depakote  500 mg twice daily.  Poor SDOH: Patient without PCP.  Will place [facility] consult for establishing care with PCP.  VTE prophylaxis:  SCDs  Diet: N.p.o. Code  Status:  Full Code Telemetry:  Admission status: Observation, Telemetry bed Patient is from: Home Anticipated d/c is to: Home Anticipated d/c is in: 1-2 days   Family Communication: Updated at bedside  Consults called: GI, general surgery   Severity of Illness: The appropriate patient status for this patient is OBSERVATION. Observation status is judged to be reasonable and necessary in order to provide the required intensity of service to ensure the patient's safety. The patient's presenting symptoms, physical exam findings, and initial radiographic and laboratory data in the context of their medical condition is felt to place them at decreased risk for further clinical deterioration. Furthermore, it is anticipated that the patient will be medically stable for discharge from the hospital within 2 midnights of admission.    Morene Bathe, MD [facility]. [facility]      [  1] No Known Allergies  "

## 2024-04-19 DIAGNOSIS — R109 Unspecified abdominal pain: Secondary | ICD-10-CM | POA: Diagnosis not present

## 2024-04-19 LAB — CBC
HCT: 35.6 % — ABNORMAL LOW (ref 39.0–52.0)
Hemoglobin: 11.8 g/dL — ABNORMAL LOW (ref 13.0–17.0)
MCH: 29.9 pg (ref 26.0–34.0)
MCHC: 33.1 g/dL (ref 30.0–36.0)
MCV: 90.4 fL (ref 80.0–100.0)
Platelets: 248 K/uL (ref 150–400)
RBC: 3.94 MIL/uL — ABNORMAL LOW (ref 4.22–5.81)
RDW: 13.6 % (ref 11.5–15.5)
WBC: 6.2 K/uL (ref 4.0–10.5)
nRBC: 0 % (ref 0.0–0.2)

## 2024-04-19 LAB — BASIC METABOLIC PANEL WITH GFR
Anion gap: 7 (ref 5–15)
BUN: 13 mg/dL (ref 6–20)
CO2: 27 mmol/L (ref 22–32)
Calcium: 8.9 mg/dL (ref 8.9–10.3)
Chloride: 103 mmol/L (ref 98–111)
Creatinine, Ser: 0.72 mg/dL (ref 0.61–1.24)
GFR, Estimated: >60 mL/min
Glucose, Bld: 84 mg/dL (ref 70–99)
Potassium: 4.4 mmol/L (ref 3.5–5.1)
Sodium: 137 mmol/L (ref 135–145)

## 2024-04-19 LAB — HIV ANTIBODY (ROUTINE TESTING W REFLEX): HIV Screen 4th Generation wRfx: NONREACTIVE

## 2024-04-19 LAB — MAGNESIUM: Magnesium: 2.1 mg/dL (ref 1.7–2.4)

## 2024-04-19 MED ORDER — PANTOPRAZOLE SODIUM 40 MG IV SOLR
40.0000 mg | Freq: Two times a day (BID) | INTRAVENOUS | Status: DC
  Administered 2024-04-19 – 2024-04-20 (×3): 40 mg via INTRAVENOUS
  Filled 2024-04-19 (×3): qty 10

## 2024-04-19 MED ORDER — DIVALPROEX SODIUM 500 MG PO DR TAB
500.0000 mg | DELAYED_RELEASE_TABLET | Freq: Two times a day (BID) | ORAL | Status: DC
  Administered 2024-04-19 – 2024-04-20 (×2): 500 mg via ORAL
  Filled 2024-04-19 (×2): qty 1

## 2024-04-19 MED ORDER — LIDOCAINE VISCOUS HCL 2 % MT SOLN: 15.0000 mL | OROMUCOSAL | Status: DC | PRN

## 2024-04-19 NOTE — Consult Note (Signed)
 "   Inpatient Consultation   Patient ID: Justin Donaldson is a 48 y.o. male.  Requesting Provider: Morene Bathe, MD  Date of Admission: 04/18/2024  Date of Consult: 04/19/2024   Reason for Consultation: duodenitis   Patient's Chief Complaint:   Chief Complaint  Patient presents with   Abdominal Pain    48 year old Caucasian male with TBI, seizures, peptic ulcer disease, tobacco dependence who presents to the emergency department with abdominal pain.  Gastroenterology was consulted for duodenitis.  Patient was in his normal state of health when he reports 4 days ago he started developing significant abdominal pain and dark stool.  He presented to the hospital overnight and underwent CT.  This was comparable to his CT in April at [facility] when he had similar symptoms.  At that point in time he was recommended to undergo endoscopy.  Is unclear if he underwent that at this time as he does not remember the hospitalization either and thought it was last year.  CT in the ER rose concern for microperforation in duodenum versus duodenal diverticulum.  He denies any nonsteroidals.  Although documented as a tobacco user, he denies cigarette use.  Abdominal pain has improved with symptomatic treatment and PPI the hospital.  No leukocytosis or abnormal vital signs.  He has nonperitoneal resting comfortably in bed and asking for increased diet on exam.  Hemoglobin on presentation 15.3 in comparison to 13.6.  It is now 12 after IVF resuscitation (1L in ER).  BUN/creatinine ratio is not elevated  Bowel movements have been dark without hematochezia diarrhea or constipation.  He denies any Pepto-Bismol or Kaopectate use.  Denies NSAIDs, Anti-plt agents, and anticoagulants Denies family history of gastrointestinal disease and malignancy Previous Endoscopies: Pt was recommended egd at [facility] 07/2023- I cannot see report- he denies having this Denies colonoscopy     Past Medical History:  Diagnosis Date    Seizures (HCC)    Traumatic brain injury (HCC)     History reviewed. No pertinent surgical history.  Allergies[1]  Family History  Problem Relation Age of Onset   CAD Father     Social History[2]   Pertinent GI related history and allergies were reviewed with the patient  Review of Systems  Constitutional:  Positive for appetite change (diminished over last 4 days). Negative for activity change, chills, diaphoresis, fatigue, fever and unexpected weight change.  HENT:  Negative for trouble swallowing and voice change.   Respiratory:  Negative for shortness of breath and wheezing.   Cardiovascular:  Negative for chest pain, palpitations and leg swelling.  Gastrointestinal:  Positive for abdominal pain and blood in stool (melena). Negative for abdominal distention, anal bleeding, constipation, diarrhea, nausea and vomiting.  Musculoskeletal:  Negative for arthralgias and myalgias.  Skin:  Negative for color change and pallor.  Neurological:  Negative for dizziness, syncope and weakness.  Psychiatric/Behavioral:  Negative for confusion. The patient is not nervous/anxious.   All other systems reviewed and are negative.    Medications Home Medications Medications Ordered Prior to Encounter[3] Pertinent GI related medications were reviewed with the patient  Inpatient Medications Current Medications[4]   acetaminophen  **OR** acetaminophen , HYDROmorphone  (DILAUDID ) injection, ondansetron  **OR** ondansetron  (ZOFRAN ) IV, oxyCODONE , senna-docusate   Objective   Vitals:   04/19/24 0011 04/19/24 0330 04/19/24 0600 04/19/24 0715  BP: 110/68 100/64 110/70 115/75  Pulse: 64 (!) 56 (!) 52 (!) 58  Resp: 11 14 14 14   Temp: 97.7 F (36.5 C)   97.8 F (36.6 C)  TempSrc:  Oral   Oral  SpO2: 99% 99% 97% 99%     Physical Exam Vitals and nursing note reviewed.  Constitutional:      General: He is not in acute distress.    Appearance: Normal appearance. He is not ill-appearing,  toxic-appearing or diaphoretic.  HENT:     Head: Normocephalic and atraumatic.     Nose: Nose normal.     Mouth/Throat:     Mouth: Mucous membranes are moist.     Pharynx: Oropharynx is clear.  Eyes:     General: No scleral icterus.    Extraocular Movements: Extraocular movements intact.  Cardiovascular:     Rate and Rhythm: Regular rhythm. Bradycardia present.     Heart sounds: Normal heart sounds. No murmur heard.    No friction rub. No gallop.  Pulmonary:     Effort: Pulmonary effort is normal. No respiratory distress.     Breath sounds: Normal breath sounds. No wheezing, rhonchi or rales.  Abdominal:     General: Abdomen is flat. Bowel sounds are normal. There is no distension.     Palpations: Abdomen is soft.     Tenderness: There is abdominal tenderness (mild; non peritoneal, no rigidity). There is no guarding or rebound.  Musculoskeletal:     Cervical back: Neck supple.     Right lower leg: No edema.     Left lower leg: No edema.  Skin:    General: Skin is warm and dry.     Coloration: Skin is not jaundiced or pale.  Neurological:     Mental Status: He is alert and oriented to person, place, and time. Mental status is at baseline.  Psychiatric:        Mood and Affect: Mood normal.        Behavior: Behavior normal.        Thought Content: Thought content normal.        Judgment: Judgment normal.     Laboratory Data Recent Labs  Lab 04/18/24 1457 04/19/24 0426  WBC 8.7 6.2  HGB 15.3 11.8*  HCT 47.3 35.6*  PLT 270 248   Recent Labs  Lab 04/18/24 1457 04/19/24 0426  NA 139 137  K 4.4 4.4  CL 101 103  CO2 25 27  BUN 14 13  CALCIUM  10.8* 8.9  PROT 8.0  --   BILITOT 0.4  --   ALKPHOS 91  --   ALT 22  --   AST 24  --   GLUCOSE 90 84   No results for input(s): INR in the last 168 hours.  Recent Labs    04/18/24 1457  LIPASE 18        Imaging Studies: CT ABDOMEN PELVIS W CONTRAST Result Date: 04/18/2024 EXAM: CT ABDOMEN AND PELVIS WITH  CONTRAST 04/18/2024 05:52:38 PM TECHNIQUE: CT of the abdomen and pelvis was performed with the administration of 100 mL of iohexol  (OMNIPAQUE ) 300 MG/ML solution. Multiplanar reformatted images are provided for review. Automated exposure control, iterative reconstruction, and/or weight-based adjustment of the mA/kV was utilized to reduce the radiation dose to as low as reasonably achievable. COMPARISON: CT abdomen and pelvis from 12/09/2023. CLINICAL HISTORY: eval duodenal perf/ulcer. hx d1/d2 ulcer, noncompliant with meds, quite tender FINDINGS: LOWER CHEST: No acute abnormality. LIVER: Mild lesion in the inferior right lobe of the liver measures 2 cm and is favored as hemangioma, unchanged. No new liver lesions are seen. GALLBLADDER AND BILE DUCTS: Gallbladder is unremarkable. No biliary ductal dilatation. SPLEEN: No acute abnormality.  PANCREAS: No acute abnormality. ADRENAL GLANDS: No acute abnormality. KIDNEYS, URETERS AND BLADDER: No stones in the kidneys or ureters. No hydronephrosis. No perinephric or periureteral stranding. Urinary bladder is unremarkable. GI AND BOWEL: Stomach demonstrates no acute abnormality. Appendix appears normal. There is wall thickening and inflammation of the proximal duodenum. There is a small diverticulum versus microperforation along the inferior margin of the proximal duodenum best seen on sagittal image 6/49 and coronal image 5/37. No other free air identified. There is no bowel obstruction. PERITONEUM AND RETROPERITONEUM: No ascites. No free air. VASCULATURE: Aorta is normal in caliber. Atherosclerotic calcifications of the aorta. LYMPH NODES: No lymphadenopathy. REPRODUCTIVE ORGANS: No acute abnormality. BONES AND SOFT TISSUES: No acute osseous abnormality. There are small fat containing inguinal hernias. No focal soft tissue abnormality. IMPRESSION: 1. Wall thickening and inflammation of the proximal duodenum with small diverticulum versus microperforation along the inferior  margin. No other free air identified. 2. Mild lesion in the inferior right lobe of the liver, favored as hemangioma, unchanged. No new liver lesions are seen. Electronically signed by: Greig Pique MD 04/18/2024 08:19 PM EST RP Workstation: [id number]    Assessment:   # Duodenitis with question of microperforation versus duodenal diverticulum - Similar presentation in April and was recommended upper endoscopy at that time as outpatient which is unclear if the patient did or did not undergo  # Abdominal pain secondary to above  #Tobacco use-patient currently denies but previously documented  Plan:   Given concern for microperforation versus duodenal diverticulum would avoid endoscopy at this time to prevent iatrogenic injury from insufflation or endoscopy Continue IV PPI 40 mg twice daily Upon discharge continue omeprazole 40 mg twice daily for 8 weeks with GI follow-up outpatient either with [facility] or [facility] who he has seen both this year in the hospital setting.  [facility] he has previously tried to set up outpatient EGD with the patient  Discussed case with surgical team Avoid aspirin  and NSAIDs (ibuprofen , Motrin , Advil , naproxen , Aleve , Naprosyn , Goody's powder, & BC powder). Avoid tobacco products  Viscous lidocaine  for pain H pylori stool testing  Management of other medical comorbidities as per primary team  I personally performed the service.  GI to sign off. Available as needed. Please do not hesitate to call regarding questions or concerns.  Thank you for allowing us  to participate in this patient's care.   Elspeth Ozell Jungling, DO [facility]  Portions of the record may have been created with voice recognition software. Occasional wrong-word or 'sound-a-like' substitutions may have occurred due to the inherent limitations of voice recognition software.  Read the chart carefully and recognize, using context, where substitutions may have occurred.     [1]  No Known Allergies [2]  Social History Tobacco Use   Smoking status: Every Day    Current packs/day: 1.00    Types: Cigarettes   Smokeless tobacco: Never  Substance Use Topics   Alcohol use: Yes    Comment: occ   Drug use: Yes    Types: Marijuana    Comment: pt states he quit smoking marijuana  [3]  No current facility-administered medications on file prior to encounter.   Current Outpatient Medications on File Prior to Encounter  Medication Sig Dispense Refill   divalproex  (DEPAKOTE ) 500 MG DR tablet Take 500 mg by mouth 2 (two) times daily.     colchicine  0.6 MG tablet Take 1 tablet (0.6 mg total) by mouth 2 (two) times daily. 60 tablet 0  dicyclomine  (BENTYL ) 20 MG tablet Take 1 tablet (20 mg total) by mouth every 6 (six) hours. (Patient not taking: Reported on 04/18/2024) 20 tablet 0   famotidine (PEPCID) 20 MG tablet Take 20 mg by mouth 2 (two) times daily. (Patient not taking: Reported on 04/18/2024)     HYDROcodone -acetaminophen  (NORCO/VICODIN) 5-325 MG tablet Take 1 tablet by mouth every 6 (six) hours as needed for severe pain. (Patient not taking: Reported on 04/18/2024) 12 tablet 0   hydrOXYzine  (ATARAX /VISTARIL ) 50 MG tablet Take 50 mg by mouth every 6 (six) hours as needed. (Patient not taking: Reported on 04/18/2024)     naproxen  (NAPROSYN ) 500 MG tablet Take 1 tablet (500 mg total) by mouth 2 (two) times daily with a meal. (Patient not taking: Reported on 04/18/2024) 20 tablet 2   nicotine  (NICODERM CQ  - DOSED IN MG/24 HOURS) 21 mg/24hr patch Place 1 patch (21 mg total) onto the skin daily. (Patient not taking: Reported on 04/04/2017) 28 patch 0   ondansetron  (ZOFRAN -ODT) 4 MG disintegrating tablet Take 1 tablet (4 mg total) by mouth every 8 (eight) hours as needed for nausea or vomiting. (Patient not taking: Reported on 04/18/2024) 20 tablet 0   pantoprazole  (PROTONIX ) 40 MG tablet Take 1 tablet (40 mg total) by mouth daily for 14 days. 14 tablet 0   propranolol  (INDERAL )  10 MG tablet Take 10 mg by mouth 3 (three) times daily. (Patient not taking: Reported on 04/18/2024)     ranitidine  (ZANTAC ) 150 MG tablet Take 1 tablet (150 mg total) by mouth 2 (two) times daily. (Patient not taking: Reported on 04/04/2017) 60 tablet 1   sertraline  (ZOLOFT ) 100 MG tablet Take 200 mg by mouth daily.     sucralfate  (CARAFATE ) 1 g tablet Take 1 tablet (1 g total) by mouth 4 (four) times daily for 7 days. 28 tablet 0   tamsulosin  (FLOMAX ) 0.4 MG CAPS capsule Take 1 capsule (0.4 mg total) by mouth daily. (Patient not taking: Reported on 04/18/2024) 7 capsule 0  [4]  Current Facility-Administered Medications:    acetaminophen  (TYLENOL ) tablet 650 mg, 650 mg, Oral, Q6H PRN **OR** acetaminophen  (TYLENOL ) suppository 650 mg, 650 mg, Rectal, Q6H PRN, Fernand Prost, MD   HYDROmorphone  (DILAUDID ) injection 0.5-1 mg, 0.5-1 mg, Intravenous, Q2H PRN, Fernand Prost, MD   ondansetron  (ZOFRAN ) tablet 4 mg, 4 mg, Oral, Q6H PRN **OR** ondansetron  (ZOFRAN ) injection 4 mg, 4 mg, Intravenous, Q6H PRN, Fernand Prost, MD   oxyCODONE  (Oxy IR/ROXICODONE ) immediate release tablet 5 mg, 5 mg, Oral, Q4H PRN, Fernand Prost, MD   pantoprazole  (PROTONIX ) injection 40 mg, 40 mg, Intravenous, Q12H, Fernand Prost, MD   senna-docusate (Senokot-S) tablet 1 tablet, 1 tablet, Oral, QHS PRN, Fernand Prost, MD   sodium chloride  flush (NS) 0.9 % injection 3 mL, 3 mL, Intravenous, Q12H, Fernand Prost, MD, 3 mL at 04/18/24 2154  Current Outpatient Medications:    divalproex  (DEPAKOTE ) 500 MG DR tablet, Take 500 mg by mouth 2 (two) times daily., Disp: , Rfl:    colchicine  0.6 MG tablet, Take 1 tablet (0.6 mg total) by mouth 2 (two) times daily., Disp: 60 tablet, Rfl: 0   dicyclomine  (BENTYL ) 20 MG tablet, Take 1 tablet (20 mg total) by mouth every 6 (six) hours. (Patient not taking: Reported on 04/18/2024), Disp: 20 tablet, Rfl: 0   famotidine (PEPCID) 20 MG tablet, Take 20 mg by mouth 2 (two) times daily. (Patient not taking:  Reported on 04/18/2024), Disp: , Rfl:    HYDROcodone -acetaminophen  (NORCO/VICODIN) 5-325  MG tablet, Take 1 tablet by mouth every 6 (six) hours as needed for severe pain. (Patient not taking: Reported on 04/18/2024), Disp: 12 tablet, Rfl: 0   hydrOXYzine  (ATARAX /VISTARIL ) 50 MG tablet, Take 50 mg by mouth every 6 (six) hours as needed. (Patient not taking: Reported on 04/18/2024), Disp: , Rfl:    naproxen  (NAPROSYN ) 500 MG tablet, Take 1 tablet (500 mg total) by mouth 2 (two) times daily with a meal. (Patient not taking: Reported on 04/18/2024), Disp: 20 tablet, Rfl: 2   nicotine  (NICODERM CQ  - DOSED IN MG/24 HOURS) 21 mg/24hr patch, Place 1 patch (21 mg total) onto the skin daily. (Patient not taking: Reported on 04/04/2017), Disp: 28 patch, Rfl: 0   ondansetron  (ZOFRAN -ODT) 4 MG disintegrating tablet, Take 1 tablet (4 mg total) by mouth every 8 (eight) hours as needed for nausea or vomiting. (Patient not taking: Reported on 04/18/2024), Disp: 20 tablet, Rfl: 0   pantoprazole  (PROTONIX ) 40 MG tablet, Take 1 tablet (40 mg total) by mouth daily for 14 days., Disp: 14 tablet, Rfl: 0   propranolol  (INDERAL ) 10 MG tablet, Take 10 mg by mouth 3 (three) times daily. (Patient not taking: Reported on 04/18/2024), Disp: , Rfl:    ranitidine  (ZANTAC ) 150 MG tablet, Take 1 tablet (150 mg total) by mouth 2 (two) times daily. (Patient not taking: Reported on 04/04/2017), Disp: 60 tablet, Rfl: 1   sertraline  (ZOLOFT ) 100 MG tablet, Take 200 mg by mouth daily., Disp: , Rfl:    sucralfate  (CARAFATE ) 1 g tablet, Take 1 tablet (1 g total) by mouth 4 (four) times daily for 7 days., Disp: 28 tablet, Rfl: 0   tamsulosin  (FLOMAX ) 0.4 MG CAPS capsule, Take 1 capsule (0.4 mg total) by mouth daily. (Patient not taking: Reported on 04/18/2024), Disp: 7 capsule, Rfl: 0  "

## 2024-04-19 NOTE — Plan of Care (Signed)

## 2024-04-19 NOTE — Discharge Instructions (Signed)
 Some PCP options in [place] area- not a comprehensive list  [facility]- [phone] [facility]- [phone] [facility]- [phone] [facility]- [phone] [facility]- [phone] [facility]- [phone]  or [facility] Referral Line [phone]

## 2024-04-19 NOTE — Consult Note (Signed)
 "  SURGICAL CONSULTATION NOTE   HISTORY OF PRESENT ILLNESS (HPI):  48 y.o. male presented to [facility] ED for evaluation of upper abdominal pain. Patient reports having progressive upper abdominal pain for the last 4 days.  Pain on the epigastric area.  No other ideation.  Patient can identify any aggravating factors.  Alleviating factor was PPIs at the ED.  In the ED he was found with stable vital signs, no fever, no hypotension.  Abdomen exam with tenderness of palpation in the epigastric and right upper quadrant area.  He had a CT scan of the abdomen and pelvis that shows significant inflammatory swelling in the duodenum.  Radiologist reported possible small diverticulum versus microperforation.  No significant free fluid.  No free air.  I personally evaluated the images.  White blood cell count yesterday was 8.7 and this morning was 6.2.  Hemoglobin 11.8.  At the moment of this evaluation, abdominal pain has resolved with minimal soreness on the epigastric area.  Surgery is consulted by Dr. Fernand in this context for evaluation and management of severe PUD with concern of microperforation.  PAST MEDICAL HISTORY (PMH):  Past Medical History:  Diagnosis Date   Seizures (HCC)    Traumatic brain injury (HCC)      PAST SURGICAL HISTORY (PSH):  History reviewed. No pertinent surgical history.   MEDICATIONS:  Prior to Admission medications  Medication Sig Start Date End Date Taking? Authorizing Provider  divalproex  (DEPAKOTE ) 500 MG DR tablet Take 500 mg by mouth 2 (two) times daily.   Yes [provider]  colchicine  0.6 MG tablet Take 1 tablet (0.6 mg total) by mouth 2 (two) times daily. 12/07/16 02/06/17  Barbette Cea, MD  dicyclomine  (BENTYL ) 20 MG tablet Take 1 tablet (20 mg total) by mouth every 6 (six) hours. Patient not taking: Reported on 04/18/2024 07/28/23   Sung, Jade J, MD  famotidine (PEPCID) 20 MG tablet Take 20 mg by mouth 2 (two) times daily. Patient not taking: Reported on  04/18/2024 08/05/23   [provider]  HYDROcodone -acetaminophen  (NORCO/VICODIN) 5-325 MG tablet Take 1 tablet by mouth every 6 (six) hours as needed for severe pain. Patient not taking: Reported on 04/18/2024 06/20/17   Arlander Charleston, MD  hydrOXYzine  (ATARAX /VISTARIL ) 50 MG tablet Take 50 mg by mouth every 6 (six) hours as needed. Patient not taking: Reported on 04/18/2024 08/06/16   [provider]  naproxen  (NAPROSYN ) 500 MG tablet Take 1 tablet (500 mg total) by mouth 2 (two) times daily with a meal. Patient not taking: Reported on 04/18/2024 06/20/17   Arlander Charleston, MD  nicotine  (NICODERM CQ  - DOSED IN MG/24 HOURS) 21 mg/24hr patch Place 1 patch (21 mg total) onto the skin daily. Patient not taking: Reported on 04/04/2017 12/07/16   Barbette Cea, MD  ondansetron  (ZOFRAN -ODT) 4 MG disintegrating tablet Take 1 tablet (4 mg total) by mouth every 8 (eight) hours as needed for nausea or vomiting. Patient not taking: Reported on 04/18/2024 07/28/23   Sung, Jade J, MD  pantoprazole  (PROTONIX ) 40 MG tablet Take 1 tablet (40 mg total) by mouth daily for 14 days. 08/04/23 08/18/23  Cyrena Mylar, MD  propranolol  (INDERAL ) 10 MG tablet Take 10 mg by mouth 3 (three) times daily. Patient not taking: Reported on 04/18/2024    [provider]  ranitidine  (ZANTAC ) 150 MG tablet Take 1 tablet (150 mg total) by mouth 2 (two) times daily. Patient not taking: Reported on 04/04/2017 12/07/16 12/07/17  Barbette Cea, MD  sertraline  (  ZOLOFT ) 100 MG tablet Take 200 mg by mouth daily. 10/23/16 10/23/17  [provider]  sucralfate  (CARAFATE ) 1 g tablet Take 1 tablet (1 g total) by mouth 4 (four) times daily for 7 days. 01/18/22 01/25/22  Angelena Smalls, MD  tamsulosin  (FLOMAX ) 0.4 MG CAPS capsule Take 1 capsule (0.4 mg total) by mouth daily. Patient not taking: Reported on 04/18/2024 06/20/17   Arlander Charleston, MD     ALLERGIES:  Allergies[1]   SOCIAL HISTORY:  Social History   Socioeconomic  History   Marital status: Single    Spouse name: Not on file   Number of children: Not on file   Years of education: Not on file   Highest education level: Not on file  Occupational History   Not on file  Tobacco Use   Smoking status: Every Day    Current packs/day: 1.00    Types: Cigarettes   Smokeless tobacco: Never  Substance and Sexual Activity   Alcohol use: Yes    Comment: occ   Drug use: Yes    Types: Marijuana    Comment: pt states he quit smoking marijuana   Sexual activity: Not on file  Other Topics Concern   Not on file  Social History Narrative   Not on file   Social Drivers of Health   Tobacco Use: High Risk (04/18/2024)   Patient History    Smoking Tobacco Use: Every Day    Smokeless Tobacco Use: Never    Passive Exposure: Not on file  Financial Resource Strain: Low Risk (08/06/2021)   Received from [facility]   Overall Financial Resource Strain (CARDIA)    Difficulty of Paying Living Expenses: Not hard at all  Food Insecurity: No Food Insecurity (08/06/2021)   Received from [facility]   Epic    Within the past 12 months, you worried that your food would run out before you got the money to buy more.: Never true    Within the past 12 months, the food you bought just didn't last and you didn't have money to get more.: Never true  Transportation Needs: No Transportation Needs (08/06/2021)   Received from [facility]   PRAPARE - Transportation    Lack of Transportation (Medical): No    Lack of Transportation (Non-Medical): No  Physical Activity: Not on file  Stress: Not on file  Social Connections: Not on file  Intimate Partner Violence: Not At Risk (08/04/2023)   Received from [facility]   Epic    Within the last year, have you been afraid of your partner or ex-partner?: No    Within the last year, have you been humiliated or emotionally abused in other ways by your partner or ex-partner?: No    Within the last year, have you been  kicked, hit, slapped, or otherwise physically hurt by your partner or ex-partner?: No    Within the last year, have you been raped or forced to have any kind of sexual activity by your partner or ex-partner?: No  Depression (PHQ2-9): Not on file  Alcohol Screen: Not on file  Housing: Not on file  Utilities: Not on file  Health Literacy: Not on file      FAMILY HISTORY:  Family History  Problem Relation Age of Onset   CAD Father      REVIEW OF SYSTEMS:  Constitutional: denies weight loss, fever, chills, or sweats  Eyes: denies any other vision changes, history of eye injury  ENT: denies  sore throat, hearing problems  Respiratory: denies shortness of breath, wheezing  Cardiovascular: denies chest pain, palpitations  Gastrointestinal: Positive abdominal pain Genitourinary: denies burning with urination or urinary frequency Musculoskeletal: denies any other joint pains or cramps  Skin: denies any other rashes or skin discolorations  Neurological: denies any other headache, dizziness, weakness  Psychiatric: denies any other depression, anxiety   All other review of systems were negative   VITAL SIGNS:  Temp:  [97.7 F (36.5 C)-98.3 F (36.8 C)] 97.8 F (36.6 C) (12/30 0715) Pulse Rate:  [52-89] 58 (12/30 0715) Resp:  [11-18] 14 (12/30 0715) BP: (100-131)/(64-100) 115/75 (12/30 0715) SpO2:  [97 %-100 %] 99 % (12/30 0715)             INTAKE/OUTPUT:  This shift: No intake/output data recorded.  Last 2 shifts: @IOLAST2SHIFTS @   PHYSICAL EXAM:  Constitutional:  -- Normal body habitus  -- Awake, alert, and oriented x3  Eyes:  -- Pupils equally round and reactive to light  -- No scleral icterus  Ear, nose, and throat:  -- No jugular venous distension  Pulmonary:  -- No crackles  -- Equal breath sounds bilaterally -- Breathing non-labored at rest Cardiovascular:  -- S1, S2 present  -- No pericardial rubs Gastrointestinal:  -- Abdomen soft, nontender, non-distended, no  guarding or rebound tenderness Musculoskeletal and Integumentary:  -- Wounds: None appreciated -- Extremities: B/L UE and LE FROM, hands and feet warm, no edema  Neurologic:  -- Motor function: intact and symmetric -- Sensation: intact and symmetric   Labs:     Latest Ref Rng & Units 04/19/2024    4:26 AM 04/18/2024    2:57 PM 08/28/2023   12:17 AM  CBC  WBC 4.0 - 10.5 K/uL 6.2  8.7  10.5   Hemoglobin 13.0 - 17.0 g/dL 88.1  84.6  86.3   Hematocrit 39.0 - 52.0 % 35.6  47.3  39.4   Platelets 150 - 400 K/uL 248  270  278       Latest Ref Rng & Units 04/19/2024    4:26 AM 04/18/2024    2:57 PM 08/28/2023   12:17 AM  CMP  Glucose 70 - 99 mg/dL 84  90  894   BUN 6 - 20 mg/dL 13  14  14    Creatinine 0.61 - 1.24 mg/dL 9.27  9.25  9.06   Sodium 135 - 145 mmol/L 137  139  131   Potassium 3.5 - 5.1 mmol/L 4.4  4.4  3.4   Chloride 98 - 111 mmol/L 103  101  92   CO2 22 - 32 mmol/L 27  25  26    Calcium  8.9 - 10.3 mg/dL 8.9  89.1  8.8   Total Protein 6.5 - 8.1 g/dL  8.0  6.3   Total Bilirubin 0.0 - 1.2 mg/dL  0.4  0.4   Alkaline Phos 38 - 126 U/L  91  68   AST 15 - 41 U/L  24  27   ALT 0 - 44 U/L  22  78     Imaging studies:  EXAM: CT ABDOMEN AND PELVIS WITH CONTRAST 04/18/2024 05:52:38 PM   TECHNIQUE: CT of the abdomen and pelvis was performed with the administration of 100 mL of iohexol  (OMNIPAQUE ) 300 MG/ML solution. Multiplanar reformatted images are provided for review. Automated exposure control, iterative reconstruction, and/or weight-based adjustment of the mA/kV was utilized to reduce the radiation dose to as low as reasonably achievable.   COMPARISON: CT  abdomen and pelvis from 12/09/2023.   CLINICAL HISTORY: eval duodenal perf/ulcer. hx d1/d2 ulcer, noncompliant with meds, quite tender   FINDINGS:   LOWER CHEST: No acute abnormality.   LIVER: Mild lesion in the inferior right lobe of the liver measures 2 cm and is favored as hemangioma, unchanged. No new  liver lesions are seen.   GALLBLADDER AND BILE DUCTS: Gallbladder is unremarkable. No biliary ductal dilatation.   SPLEEN: No acute abnormality.   PANCREAS: No acute abnormality.   ADRENAL GLANDS: No acute abnormality.   KIDNEYS, URETERS AND BLADDER: No stones in the kidneys or ureters. No hydronephrosis. No perinephric or periureteral stranding. Urinary bladder is unremarkable.   GI AND BOWEL: Stomach demonstrates no acute abnormality. Appendix appears normal. There is wall thickening and inflammation of the proximal duodenum. There is a small diverticulum versus microperforation along the inferior margin of the proximal duodenum best seen on sagittal image 6/49 and coronal image 5/37. No other free air identified. There is no bowel obstruction.   PERITONEUM AND RETROPERITONEUM: No ascites. No free air.   VASCULATURE: Aorta is normal in caliber. Atherosclerotic calcifications of the aorta.   LYMPH NODES: No lymphadenopathy.   REPRODUCTIVE ORGANS: No acute abnormality.   BONES AND SOFT TISSUES: No acute osseous abnormality. There are small fat containing inguinal hernias. No focal soft tissue abnormality.   IMPRESSION: 1. Wall thickening and inflammation of the proximal duodenum with small diverticulum versus microperforation along the inferior margin. No other free air identified. 2. Mild lesion in the inferior right lobe of the liver, favored as hemangioma, unchanged. No new liver lesions are seen.   Electronically signed by: Greig Pique MD 04/18/2024 08:19 PM EST RP Workstation: [id number]  Assessment/Plan:  48 y.o. male with severe PUD, complicated by pertinent comorbidities including smoker, history of seizures.  Severe peptic ulcer disease - I personally evaluated the images and there is minimal surrounding inflammation with wall thickening of the duodenum consistent with peptic ulcer disease - Patient has history of peptic ulcer disease with previous  admission in April 2025 - I have very low suspicious of peripheral urgencies there is no significant free fluid and there is no free air - Also he has significant clinical improvement with PPIs given at the ED - I did not recommend any further workup - Continue with medical management with high-dose PPIs and follow-up GI recommendation regarding his work up longer-term treatment to prevent recurrences - No contraindication to start diet if no GI procedures planned.  -Diet can be advanced as tolerated from medical management standpoint.  - Encourage smoking cessation with highly increased risk of peptic ulcer disease and its complications   Lucas Petrin, MD     [1] No Known Allergies  "

## 2024-04-19 NOTE — TOC CM/SW Note (Signed)
 Transition of Care [facility]) CM/SW Note   Transition of Care [facility]) - Inpatient Brief Assessment   Patient Details  Name: Justin Donaldson MRN: [id number] Date of Birth: May 01, 1975  Transition of Care [facility]) CM/SW Contact:    Alvaro Louder, LCSW Phone Number: 04/19/2024, 1:06 PM   Clinical Narrative:  Per Consult PCP list has been placed in AVS. No other TOC needs at this time. Please consult if they arise.  TOC to follow for discharge  Transition of Care Asessment: Insurance and Status: Insurance coverage has been reviewed Patient has primary care physician: No Home environment has been reviewed: Single Family Home Prior level of function:: Ox4 Prior/Current Home Services: No current home services Social Drivers of Health Review: SDOH reviewed no interventions necessary Readmission risk has been reviewed: Yes Transition of care needs: transition of care needs identified, TOC will continue to follow

## 2024-04-19 NOTE — Progress Notes (Signed)
 "  PROGRESS NOTE    Justin Donaldson  FMW:[id number]  DOB: Jan 02, 1976  DOA: 04/18/2024 PCP: Justin Donaldson:   Hospital course:  48 y.o. year old male with medical history of TBI, seizures, PUD and tobacco use disorder presenting to the ED with severe abdominal pain for 4 days with nausea,, vomiting and an episode of melena.  Workup in the ED was notable for wall thickening and inflammation of the proximal duodenum with a possible small diverticulum versus perforation along the inferior margin, no free air.  Patient was admitted for management of duodenitis and evaluation for microperforation.  Patient's abdominal pain improved markedly after getting IV PPI.  Subjective:  Patient states abdominal pain is much better but is still not completely gone.  States he would like to try to eat, denies nausea.  Objective: Vitals:   04/19/24 0330 04/19/24 0600 04/19/24 0715 04/19/24 0805  BP: 100/64 110/70 115/75 110/82  Pulse: (!) 56 (!) 52 (!) 58 (!) 57  Resp: 14 14 14 17   Temp:   97.8 F (36.6 C) 97.8 F (36.6 C)  TempSrc:   Oral Oral  SpO2: 99% 97% 99% 99%    Intake/Output Summary (Last 24 hours) at 04/19/2024 1748 Last data filed at 04/19/2024 1300 Gross per 24 hour  Intake 246 ml  Output 550 ml  Net -304 ml   There were no vitals filed for this visit.   Exam:  General: Patient lying in bed in NAD with arms above his head Eyes: sclera anicteric, conjuctiva mild injection bilaterally CVS: S1-S2, regular  Respiratory: CTA   GI: NABS, soft, mild tenderness to deep palpation epigastric region, no rebound tenderness LE: Warm and well-perfused Neuro: A/O x 3,  grossly nonfocal.   Data Reviewed:  Basic Metabolic Panel: Recent Labs  Lab 04/18/24 1457 04/19/24 0426  NA 139 137  K 4.4 4.4  CL 101 103  CO2 25 27  GLUCOSE 90 84  BUN 14 13  CREATININE 0.74 0.72  CALCIUM  10.8* 8.9  MG 2.1  --     CBC: Recent Labs  Lab 04/18/24 1457 04/19/24 0426   WBC 8.7 6.2  HGB 15.3 11.8*  HCT 47.3 35.6*  MCV 91.0 90.4  PLT 270 248     Scheduled Meds:  pantoprazole  (PROTONIX ) IV  40 mg Intravenous Q12H   sodium chloride  flush  3 mL Intravenous Q12H   Continuous Infusions:   Assessment & Plan:   Duodenitis/PUD Pain much improved with IV PPI Patient seen by general surgery who note there is no evidence for perforation, no further surgical workup warranted Patient discussed with GI who note they do not intend to do any endoscopy during the hospital stay and recommend discharge on PPI with follow-up with GI for outpatient endoscopy. Will advance diet as per patient request  Melena Anemia Hemoglobin has decreased from 15-11.8 today Blood pressure has been low normal, heart rate is low normal Will repeat H&H in the morning to ensure stability  Seizure disorder Restart divalproex  per home doses    DVT prophylaxis: SCD Code Status: Full Family Communication: None today     Studies: CT ABDOMEN PELVIS W CONTRAST Result Date: 04/18/2024 EXAM: CT ABDOMEN AND PELVIS WITH CONTRAST 04/18/2024 05:52:38 PM TECHNIQUE: CT of the abdomen and pelvis was performed with the administration of 100 mL of iohexol  (OMNIPAQUE ) 300 MG/ML solution. Multiplanar reformatted images are provided for review. Automated exposure control, iterative reconstruction, and/or weight-based adjustment of the mA/kV was utilized to reduce the  radiation dose to as low as reasonably achievable. COMPARISON: CT abdomen and pelvis from 12/09/2023. CLINICAL HISTORY: eval duodenal perf/ulcer. hx d1/d2 ulcer, noncompliant with meds, quite tender FINDINGS: LOWER CHEST: No acute abnormality. LIVER: Mild lesion in the inferior right lobe of the liver measures 2 cm and is favored as hemangioma, unchanged. No new liver lesions are seen. GALLBLADDER AND BILE DUCTS: Gallbladder is unremarkable. No biliary ductal dilatation. SPLEEN: No acute abnormality. PANCREAS: No acute abnormality.  ADRENAL GLANDS: No acute abnormality. KIDNEYS, URETERS AND BLADDER: No stones in the kidneys or ureters. No hydronephrosis. No perinephric or periureteral stranding. Urinary bladder is unremarkable. GI AND BOWEL: Stomach demonstrates no acute abnormality. Appendix appears normal. There is wall thickening and inflammation of the proximal duodenum. There is a small diverticulum versus microperforation along the inferior margin of the proximal duodenum best seen on sagittal image 6/49 and coronal image 5/37. No other free air identified. There is no bowel obstruction. PERITONEUM AND RETROPERITONEUM: No ascites. No free air. VASCULATURE: Aorta is normal in caliber. Atherosclerotic calcifications of the aorta. LYMPH NODES: No lymphadenopathy. REPRODUCTIVE ORGANS: No acute abnormality. BONES AND SOFT TISSUES: No acute osseous abnormality. There are small fat containing inguinal hernias. No focal soft tissue abnormality. IMPRESSION: 1. Wall thickening and inflammation of the proximal duodenum with small diverticulum versus microperforation along the inferior margin. No other free air identified. 2. Mild lesion in the inferior right lobe of the liver, favored as hemangioma, unchanged. No new liver lesions are seen. Electronically signed by: Greig Pique MD 04/18/2024 08:19 PM EST RP Workstation: [id number]    Principal Problem:   Abdominal pain, acute Active Problems:   Seizure disorder (HCC)   History of peptic ulcer disease     Justin Donaldson, [facility]  If 7PM-7AM, please contact night-coverage [email or web address]   LOS: 0 days  "

## 2024-04-20 DIAGNOSIS — R109 Unspecified abdominal pain: Secondary | ICD-10-CM | POA: Diagnosis not present

## 2024-04-20 LAB — CBC
HCT: 37.5 % — ABNORMAL LOW (ref 39.0–52.0)
Hemoglobin: 12.4 g/dL — ABNORMAL LOW (ref 13.0–17.0)
MCH: 30 pg (ref 26.0–34.0)
MCHC: 33.1 g/dL (ref 30.0–36.0)
MCV: 90.6 fL (ref 80.0–100.0)
Platelets: 228 K/uL (ref 150–400)
RBC: 4.14 MIL/uL — ABNORMAL LOW (ref 4.22–5.81)
RDW: 13.6 % (ref 11.5–15.5)
WBC: 6 K/uL (ref 4.0–10.5)
nRBC: 0 % (ref 0.0–0.2)

## 2024-04-20 LAB — GLUCOSE, CAPILLARY: Glucose-Capillary: 83 mg/dL (ref 70–99)

## 2024-04-20 MED ORDER — PANTOPRAZOLE SODIUM 40 MG PO TBEC: 40.0000 mg | DELAYED_RELEASE_TABLET | Freq: Two times a day (BID) | ORAL | 0 refills | Status: AC

## 2024-04-20 NOTE — Discharge Summary (Signed)
 "                                                                                                                                                                             Justin Donaldson FMW:[id number] DOB: [birth year] DOA: 04/18/2024  PCP: Pcp, No  Admit date: 04/18/2024  Discharge date: 04/20/2024  Admitted From: Home   disposition: Home  Recommendations for Outpatient Follow-up:   Follow up with PCP in 1-2 weeks  PCP Please follow up on the following pending results: H. pylori test   Home Health: N/A Equipment/Devices: N/A Consultations: General surgery, gastroenterology Discharge Condition: Improved CODE STATUS: Full Diet Recommendation: Regular  Diet Order             DIET SOFT Room service appropriate? Yes; Fluid consistency: Thin  Diet effective now                    Chief Complaint  Patient presents with   Abdominal Pain     Brief history of present illness from the day of admission and additional interim summary    Justin Donaldson is a 48 y.o. year old male with medical history of TBI, seizures, PUD and tobacco use disorder presenting to the ED with abdominal pain.  Patient states that has been happening for 4 days.  He also reports significant nausea states he has had an episode of hematemesis and melena. About 5 episodes of vomiting with one episode of melena. On arrival to the ED patient was noted to be HDS stable.  Lab work and imaging performed.  CBC unremarkable with baseline hemoglobin.  CMP overall unremarkable except mild hypercalcemia.  Lipase within normal limits.  UA without signs of infection.  CT abdomen pelvis obtained shows wall thickening and inflammation of the proximal duodenum with small diverticulum versus perforation along the inferior margin with no free air.  There is mild lesion on the inferior right lobe of the liver which is favored to be hemangioma and unchanged from previous studies.  Given patient's complaint of pain and image  findings, [facility] consulted for admission.                                                                  Hospital Course   Patient was evaluated by general surgery and gastroenterology.    General surgery notes: Severe peptic ulcer disease - I personally evaluated the images and there is minimal surrounding inflammation with wall thickening of the duodenum  consistent with peptic ulcer disease - Patient has history of peptic ulcer disease with previous admission in April 2025 - I have very low suspicious of peripheral urgencies there is no significant free fluid and there is no free air - Also he has significant clinical improvement with PPIs given at the ED - I did not recommend any further workup - Continue with medical management with high-dose PPIs and follow-up GI recommendation regarding his work up longer-term treatment to prevent  - Encourage smoking cessation with highly increased risk of peptic ulcer disease and its complications  Gastroenterology notes: Given concern for microperforation versus duodenal diverticulum would avoid endoscopy at this time to prevent iatrogenic injury from insufflation or endoscopy Continue pantoprazole  40 mg twice daily for 8 weeks with GI follow-up outpatient either with [facility] or [facility] who he has seen both this year in the hospital setting.  [facility] he has previously tried to set up outpatient EGD with the patient Avoid aspirin  and NSAIDs (ibuprofen , Motrin , Advil , naproxen , Aleve , Naprosyn , Goody's powder, & BC powder). Avoid tobacco products Viscous lidocaine  for pain H pylori stool testing ordered and pending.   Patient was much improved symptomatically with treatment with IV PPI.  Patient is discharged home on pantoprazole  40 twice daily to follow-up with gastroenterology in 8 weeks as noted above.  H. pylori testing was ordered and should be followed up by PCP.  Patient was instructed to let PCP know.     Discharge diagnosis     Principal  Problem:   Abdominal pain, acute Active Problems:   Seizure disorder (HCC)   History of peptic ulcer disease    Discharge instructions    Discharge Instructions     Call MD for:  persistant nausea and vomiting   Complete by: As directed    Call MD for:  severe uncontrolled pain   Complete by: As directed    Discharge instructions   Complete by: As directed    It is very important you take pantoprazole  twice a day, do not skip doses. You will need to make an appointment with Gastroenterology (either at [facility] or [facility]) so you can get an EGD in 8 weeks. You will need to see your PCP in a couple of weeks to see how you are doing. At that time, they need to check you H PYLORI test and treat you if the results are positive. If we cannot get a stool specimen from you before you leave, your doctor will need to order the H PYLORI test as an outpatient.   As discussed with you,  Avoid aspirin  and NSAIDs (ibuprofen , Motrin , Advil , naproxen , Aleve , Naprosyn , Goody's powder, & BC powder). Avoid tobacco products   Increase activity slowly   Complete by: As directed        Discharge Medications   Allergies as of 04/20/2024   No Known Allergies      Medication List     STOP taking these medications    colchicine  0.6 MG tablet   dicyclomine  20 MG tablet Commonly known as: BENTYL    HYDROcodone -acetaminophen  5-325 MG tablet Commonly known as: NORCO/VICODIN   hydrOXYzine  50 MG tablet Commonly known as: ATARAX    naproxen  500 MG tablet Commonly known as: Naprosyn    ondansetron  4 MG disintegrating tablet Commonly known as: ZOFRAN -ODT   propranolol  10 MG tablet Commonly known as: INDERAL    ranitidine  150 MG tablet Commonly known as: ZANTAC    sucralfate  1 g tablet Commonly known as: Carafate   TAKE these medications    divalproex  500 MG DR tablet Commonly known as: DEPAKOTE  Take 500 mg by mouth 2 (two) times daily.   famotidine 20 MG tablet Commonly known as:  PEPCID Take 20 mg by mouth 2 (two) times daily.   nicotine  21 mg/24hr patch Commonly known as: NICODERM CQ  - dosed in mg/24 hours Place 1 patch (21 mg total) onto the skin daily.   pantoprazole  40 MG tablet Commonly known as: Protonix  Take 1 tablet (40 mg total) by mouth 2 (two) times daily. What changed: when to take this   sertraline  100 MG tablet Commonly known as: ZOLOFT  Take 200 mg by mouth daily.   tamsulosin  0.4 MG Caps capsule Commonly known as: Flomax  Take 1 capsule (0.4 mg total) by mouth daily.          Major procedures and Radiology Reports - PLEASE review detailed and final reports thoroughly  -      CT ABDOMEN PELVIS W CONTRAST Result Date: 04/18/2024 EXAM: CT ABDOMEN AND PELVIS WITH CONTRAST 04/18/2024 05:52:38 PM TECHNIQUE: CT of the abdomen and pelvis was performed with the administration of 100 mL of iohexol  (OMNIPAQUE ) 300 MG/ML solution. Multiplanar reformatted images are provided for review. Automated exposure control, iterative reconstruction, and/or weight-based adjustment of the mA/kV was utilized to reduce the radiation dose to as low as reasonably achievable. COMPARISON: CT abdomen and pelvis from 12/09/2023. CLINICAL HISTORY: eval duodenal perf/ulcer. hx d1/d2 ulcer, noncompliant with meds, quite tender FINDINGS: LOWER CHEST: No acute abnormality. LIVER: Mild lesion in the inferior right lobe of the liver measures 2 cm and is favored as hemangioma, unchanged. No new liver lesions are seen. GALLBLADDER AND BILE DUCTS: Gallbladder is unremarkable. No biliary ductal dilatation. SPLEEN: No acute abnormality. PANCREAS: No acute abnormality. ADRENAL GLANDS: No acute abnormality. KIDNEYS, URETERS AND BLADDER: No stones in the kidneys or ureters. No hydronephrosis. No perinephric or periureteral stranding. Urinary bladder is unremarkable. GI AND BOWEL: Stomach demonstrates no acute abnormality. Appendix appears normal. There is wall thickening and inflammation of the  proximal duodenum. There is a small diverticulum versus microperforation along the inferior margin of the proximal duodenum best seen on sagittal image 6/49 and coronal image 5/37. No other free air identified. There is no bowel obstruction. PERITONEUM AND RETROPERITONEUM: No ascites. No free air. VASCULATURE: Aorta is normal in caliber. Atherosclerotic calcifications of the aorta. LYMPH NODES: No lymphadenopathy. REPRODUCTIVE ORGANS: No acute abnormality. BONES AND SOFT TISSUES: No acute osseous abnormality. There are small fat containing inguinal hernias. No focal soft tissue abnormality. IMPRESSION: 1. Wall thickening and inflammation of the proximal duodenum with small diverticulum versus microperforation along the inferior margin. No other free air identified. 2. Mild lesion in the inferior right lobe of the liver, favored as hemangioma, unchanged. No new liver lesions are seen. Electronically signed by: Greig Pique MD 04/18/2024 08:19 PM EST RP Workstation: [id number]    Micro Results    No results found for this or any previous visit (from the past 240 hours).  Today   Subjective    Justin Donaldson feels much improved since admission.  Feels ready to go home.  Denies chest pain, shortness of breath or abdominal pain.  Feels they can take care of themselves with the resources they have at home.  Objective   Blood pressure 101/71, pulse 65, temperature 98.8 F (37.1 C), resp. rate 16, height 5' 9 (1.753 m), weight 71.2 kg, SpO2 100%.   Intake/Output Summary (Last 24 hours) at 04/20/2024 1201 Last  data filed at 04/20/2024 0900 Gross per 24 hour  Intake 960 ml  Output --  Net 960 ml    Exam General: Patient appears well and in good spirits sitting up in bed in no acute distress.  Eyes: sclera anicteric, conjuctiva mild injection bilaterally CVS: S1-S2, regular  Respiratory:  decreased air entry bilaterally secondary to decreased inspiratory effort, rales at bases  GI: NABS,  soft, NT  LE: No edema.  Neuro: A/O x 3, Moving all extremities equally with normal strength, CN 3-12 intact, grossly nonfocal.  Psych: patient is logical and coherent, judgement and insight appear normal, mood and affect appropriate to situation.    Data Review   CBC w Diff:  Lab Results  Component Value Date   WBC 6.0 04/20/2024   HGB 12.4 (L) 04/20/2024   HCT 37.5 (L) 04/20/2024   PLT 228 04/20/2024   LYMPHOPCT 24 04/04/2017   MONOPCT 10 04/04/2017   EOSPCT 4 04/04/2017   BASOPCT 1 04/04/2017    CMP:  Lab Results  Component Value Date   NA 137 04/19/2024   K 4.4 04/19/2024   CL 103 04/19/2024   CO2 27 04/19/2024   BUN 13 04/19/2024   CREATININE 0.72 04/19/2024   PROT 8.0 04/18/2024   ALBUMIN 4.4 04/18/2024   BILITOT 0.4 04/18/2024   ALKPHOS 91 04/18/2024   AST 24 04/18/2024   ALT 22 04/18/2024  .   Total Time in preparing paper work, data evaluation and todays exam - 35 minutes  Ivan Vangie Pike M.D on 04/20/2024 at 12:01 PM  [facility]    "

## 2024-04-20 NOTE — Plan of Care (Signed)

## 2024-04-20 NOTE — Progress Notes (Signed)
 Patient ID: Justin Donaldson, male   DOB: [birth year], 48 y.o.   MRN: [id number]     SURGICAL PROGRESS NOTE   Hospital Day(s): 0.   Interval History: Patient seen and examined, no acute events or new complaints overnight. Patient reports feeling well.  Patient denies any abdominal pain.  No pain with patient.  No alleviating or aggravating factors.  He endorses that he ate a bunch of food for dinner last night without any abdominal pain.  Denies any nausea or vomiting.  Vital signs in last 24 hours: [min-max] current  Temp:  [97.6 F (36.4 C)-98.8 F (37.1 C)] 97.6 F (36.4 C) (12/31 0441) Pulse Rate:  [57-63] 63 (12/31 0441) Resp:  [17-18] 18 (12/31 0441) BP: (104-110)/(71-82) 104/74 (12/31 0441) SpO2:  [96 %-99 %] 98 % (12/31 0441) Weight:  [71.2 kg] 71.2 kg (12/31 0441)       Weight: 71.2 kg     Physical Exam:  Constitutional: alert, cooperative and no distress  Respiratory: breathing non-labored at rest  Cardiovascular: regular rate and sinus rhythm  Gastrointestinal: soft, non-tender, and non-distended  Labs:     Latest Ref Rng & Units 04/20/2024    6:11 AM 04/19/2024    4:26 AM 04/18/2024    2:57 PM  CBC  WBC 4.0 - 10.5 K/uL 6.0  6.2  8.7   Hemoglobin 13.0 - 17.0 g/dL 87.5  88.1  84.6   Hematocrit 39.0 - 52.0 % 37.5  35.6  47.3   Platelets 150 - 400 K/uL 228  248  270       Latest Ref Rng & Units 04/19/2024    4:26 AM 04/18/2024    2:57 PM 08/28/2023   12:17 AM  CMP  Glucose 70 - 99 mg/dL 84  90  894   BUN 6 - 20 mg/dL 13  14  14    Creatinine 0.61 - 1.24 mg/dL 9.27  9.25  9.06   Sodium 135 - 145 mmol/L 137  139  131   Potassium 3.5 - 5.1 mmol/L 4.4  4.4  3.4   Chloride 98 - 111 mmol/L 103  101  92   CO2 22 - 32 mmol/L 27  25  26    Calcium  8.9 - 10.3 mg/dL 8.9  89.1  8.8   Total Protein 6.5 - 8.1 g/dL  8.0  6.3   Total Bilirubin 0.0 - 1.2 mg/dL  0.4  0.4   Alkaline Phos 38 - 126 U/L  91  68   AST 15 - 41 U/L  24  27   ALT 0 - 44 U/L  22  78     Imaging  studies: No new pertinent imaging studies   Assessment/Plan:  48 y.o. male with severe PUD, complicated by pertinent comorbidities including smoker, history of seizures.   Severe peptic ulcer disease - Patient responding well to medical treatment of severe peptic ulcer disease - Abdominal signs completely benign - Patient walking comfortably around the room - She tolerated soft diet without any abdominal pain - There is no sign of perforation - Continue medical management as per primary team and GI - No surgical indication.  General surgery will sign off.  No surgical follow-up needed - Encourage smoking cessation as a risk factor of peptic ulcer disease.   Lucas Petrin, MD
